# Patient Record
Sex: Male | Born: 1980 | Race: White | Hispanic: No | Marital: Married | Smoking: Former smoker
Health system: Southern US, Community
[De-identification: ages and names within clinical notes are randomized; demographics above are authoritative.]

## PROBLEM LIST (undated history)

## (undated) DIAGNOSIS — K219 Gastro-esophageal reflux disease without esophagitis: Secondary | ICD-10-CM

## (undated) DIAGNOSIS — E119 Type 2 diabetes mellitus without complications: Secondary | ICD-10-CM

## (undated) DIAGNOSIS — G709 Myoneural disorder, unspecified: Secondary | ICD-10-CM

## (undated) DIAGNOSIS — G44009 Cluster headache syndrome, unspecified, not intractable: Secondary | ICD-10-CM

## (undated) DIAGNOSIS — I10 Essential (primary) hypertension: Secondary | ICD-10-CM

## (undated) DIAGNOSIS — G4730 Sleep apnea, unspecified: Secondary | ICD-10-CM

## (undated) DIAGNOSIS — E7800 Pure hypercholesterolemia, unspecified: Secondary | ICD-10-CM

## (undated) HISTORY — DX: Cluster headache syndrome, unspecified, not intractable: G44.009

## (undated) HISTORY — DX: Pure hypercholesterolemia, unspecified: E78.00

## (undated) HISTORY — DX: Essential (primary) hypertension: I10

## (undated) HISTORY — PX: OTHER SURGICAL HISTORY: SHX169

---

## 2001-06-03 ENCOUNTER — Ambulatory Visit (HOSPITAL_COMMUNITY): Admission: RE | Admit: 2001-06-03 | Discharge: 2001-06-03 | Payer: Self-pay | Admitting: Pulmonary Disease

## 2002-05-21 ENCOUNTER — Encounter: Admission: RE | Admit: 2002-05-21 | Discharge: 2002-08-19 | Payer: Self-pay | Admitting: Specialist

## 2004-08-15 ENCOUNTER — Encounter: Admission: RE | Admit: 2004-08-15 | Discharge: 2004-08-15 | Payer: Self-pay | Admitting: Pulmonary Disease

## 2005-03-28 ENCOUNTER — Ambulatory Visit: Payer: Self-pay | Admitting: Endocrinology

## 2005-09-01 ENCOUNTER — Emergency Department (HOSPITAL_COMMUNITY): Admission: EM | Admit: 2005-09-01 | Discharge: 2005-09-01 | Payer: Self-pay | Admitting: Emergency Medicine

## 2006-03-13 ENCOUNTER — Ambulatory Visit: Payer: Self-pay | Admitting: Endocrinology

## 2006-04-01 ENCOUNTER — Ambulatory Visit: Payer: Self-pay | Admitting: Endocrinology

## 2006-04-12 ENCOUNTER — Ambulatory Visit: Payer: Self-pay | Admitting: Endocrinology

## 2006-05-09 ENCOUNTER — Ambulatory Visit: Payer: Self-pay | Admitting: Endocrinology

## 2006-06-10 ENCOUNTER — Ambulatory Visit: Payer: Self-pay | Admitting: Endocrinology

## 2006-11-08 ENCOUNTER — Ambulatory Visit: Payer: Self-pay | Admitting: Endocrinology

## 2007-10-20 ENCOUNTER — Encounter: Payer: Self-pay | Admitting: Endocrinology

## 2008-01-10 ENCOUNTER — Emergency Department (HOSPITAL_COMMUNITY): Admission: EM | Admit: 2008-01-10 | Discharge: 2008-01-10 | Payer: Self-pay | Admitting: Family Medicine

## 2008-02-11 ENCOUNTER — Encounter: Admission: RE | Admit: 2008-02-11 | Discharge: 2008-02-11 | Payer: Self-pay | Admitting: Otolaryngology

## 2009-01-03 ENCOUNTER — Encounter: Admission: RE | Admit: 2009-01-03 | Discharge: 2009-01-03 | Payer: Self-pay | Admitting: Internal Medicine

## 2009-03-23 ENCOUNTER — Encounter: Admission: RE | Admit: 2009-03-23 | Discharge: 2009-03-23 | Payer: Self-pay | Admitting: Internal Medicine

## 2009-10-25 ENCOUNTER — Encounter: Admission: RE | Admit: 2009-10-25 | Discharge: 2009-10-25 | Payer: Self-pay | Admitting: Emergency Medicine

## 2010-11-12 ENCOUNTER — Encounter: Payer: Self-pay | Admitting: Internal Medicine

## 2010-11-21 NOTE — Miscellaneous (Signed)
Summary: allopurinol  Clinical Lists Changes  Medications: Added new medication of ALLOPURINOL 100 MG  TABS (ALLOPURINOL) take 1 by mouth once daily Need office visit - Signed Rx of ALLOPURINOL 100 MG  TABS (ALLOPURINOL) take 1 by mouth once daily Need office visit;  #30 x 0;  Signed;  Entered by: Orlan Leavens;  Authorized by: Minus Breeding MD;  Method used: Electronic    Prescriptions: ALLOPURINOL 100 MG  TABS (ALLOPURINOL) take 1 by mouth once daily Need office visit  #30 x 0   Entered by:   Orlan Leavens   Authorized by:   Minus Breeding MD   Signed by:   Orlan Leavens on 10/20/2007   Method used:   Electronically sent to ...       [facility]*       [address]       [place], [place]  [address]       Ph: [phone]       Fax: [phone]   RxID:   [id number]   Denied need office visit last ov 04/12/2006

## 2011-05-10 ENCOUNTER — Other Ambulatory Visit: Payer: Self-pay | Admitting: Occupational Medicine

## 2011-05-10 ENCOUNTER — Ambulatory Visit
Admission: RE | Admit: 2011-05-10 | Discharge: 2011-05-10 | Disposition: A | Discharge status: Home / Self Care | Payer: Worker's Compensation | Source: Ambulatory Visit | Attending: Occupational Medicine | Admitting: Occupational Medicine

## 2011-05-10 DIAGNOSIS — S9782XA Crushing injury of left foot, initial encounter: Secondary | ICD-10-CM

## 2011-10-21 ENCOUNTER — Ambulatory Visit (INDEPENDENT_AMBULATORY_CARE_PROVIDER_SITE_OTHER): Payer: 59

## 2011-10-21 DIAGNOSIS — R3589 Other polyuria: Secondary | ICD-10-CM

## 2011-10-21 DIAGNOSIS — N476 Balanoposthitis: Secondary | ICD-10-CM

## 2011-10-21 DIAGNOSIS — R7309 Other abnormal glucose: Secondary | ICD-10-CM

## 2011-10-21 DIAGNOSIS — R358 Other polyuria: Secondary | ICD-10-CM

## 2011-10-21 DIAGNOSIS — R631 Polydipsia: Secondary | ICD-10-CM

## 2014-12-12 ENCOUNTER — Encounter (HOSPITAL_COMMUNITY): Payer: Self-pay | Admitting: *Deleted

## 2014-12-12 ENCOUNTER — Ambulatory Visit (INDEPENDENT_AMBULATORY_CARE_PROVIDER_SITE_OTHER): Payer: 59 | Admitting: Emergency Medicine

## 2014-12-12 ENCOUNTER — Emergency Department (HOSPITAL_COMMUNITY): Payer: 59

## 2014-12-12 ENCOUNTER — Emergency Department (HOSPITAL_COMMUNITY)
Admission: EM | Admit: 2014-12-12 | Discharge: 2014-12-12 | Disposition: A | Discharge status: Home / Self Care | Payer: 59 | Attending: Emergency Medicine | Admitting: Emergency Medicine

## 2014-12-12 VITALS — BP 134/72 | HR 101 | Temp 98.1°F | Resp 18 | Ht 69.5 in | Wt 329.0 lb

## 2014-12-12 DIAGNOSIS — E785 Hyperlipidemia, unspecified: Secondary | ICD-10-CM | POA: Insufficient documentation

## 2014-12-12 DIAGNOSIS — R110 Nausea: Secondary | ICD-10-CM | POA: Insufficient documentation

## 2014-12-12 DIAGNOSIS — E119 Type 2 diabetes mellitus without complications: Secondary | ICD-10-CM | POA: Insufficient documentation

## 2014-12-12 DIAGNOSIS — Z6841 Body Mass Index (BMI) 40.0 and over, adult: Secondary | ICD-10-CM | POA: Insufficient documentation

## 2014-12-12 DIAGNOSIS — H53452 Other localized visual field defect, left eye: Secondary | ICD-10-CM

## 2014-12-12 DIAGNOSIS — Z87891 Personal history of nicotine dependence: Secondary | ICD-10-CM | POA: Diagnosis not present

## 2014-12-12 DIAGNOSIS — I10 Essential (primary) hypertension: Secondary | ICD-10-CM | POA: Insufficient documentation

## 2014-12-12 DIAGNOSIS — R519 Headache, unspecified: Secondary | ICD-10-CM

## 2014-12-12 DIAGNOSIS — Z79899 Other long term (current) drug therapy: Secondary | ICD-10-CM | POA: Diagnosis not present

## 2014-12-12 DIAGNOSIS — Z794 Long term (current) use of insulin: Secondary | ICD-10-CM | POA: Diagnosis not present

## 2014-12-12 DIAGNOSIS — R51 Headache: Secondary | ICD-10-CM | POA: Diagnosis present

## 2014-12-12 HISTORY — DX: Type 2 diabetes mellitus without complications: E11.9

## 2014-12-12 LAB — BASIC METABOLIC PANEL
Anion gap: 9 (ref 5–15)
BUN: 8 mg/dL (ref 6–23)
CO2: 27 mmol/L (ref 19–32)
Calcium: 9.3 mg/dL (ref 8.4–10.5)
Chloride: 98 mmol/L (ref 96–112)
Creatinine, Ser: 0.76 mg/dL (ref 0.50–1.35)
GFR calc Af Amer: >90 mL/min (ref >90)
GFR calc non Af Amer: >90 mL/min (ref >90)
Glucose, Bld: 324 mg/dL — ABNORMAL HIGH (ref 70–99)
Potassium: 3.8 mmol/L (ref 3.5–5.1)
Sodium: 134 mmol/L — ABNORMAL LOW (ref 135–145)

## 2014-12-12 LAB — CBC WITH DIFFERENTIAL/PLATELET
Basophils Absolute: 0 10*3/uL (ref 0.0–0.1)
Basophils Relative: 0 % (ref 0–1)
Eosinophils Absolute: 0.2 10*3/uL (ref 0.0–0.7)
Eosinophils Relative: 2 % (ref 0–5)
HCT: 41.9 % (ref 39.0–52.0)
Hemoglobin: 14.5 g/dL (ref 13.0–17.0)
Lymphocytes Relative: 31 % (ref 12–46)
Lymphs Abs: 2.2 10*3/uL (ref 0.7–4.0)
MCH: 31.3 pg (ref 26.0–34.0)
MCHC: 34.6 g/dL (ref 30.0–36.0)
MCV: 90.5 fL (ref 78.0–100.0)
Monocytes Absolute: 0.5 10*3/uL (ref 0.1–1.0)
Monocytes Relative: 6 % (ref 3–12)
Neutro Abs: 4.4 10*3/uL (ref 1.7–7.7)
Neutrophils Relative %: 61 % (ref 43–77)
Platelets: 157 10*3/uL (ref 150–400)
RBC: 4.63 MIL/uL (ref 4.22–5.81)
RDW: 12.2 % (ref 11.5–15.5)
WBC: 7.3 10*3/uL (ref 4.0–10.5)

## 2014-12-12 MED ORDER — DIPHENHYDRAMINE HCL 50 MG/ML IJ SOLN
25.0000 mg | Freq: Once | INTRAMUSCULAR | Status: AC
Start: 2014-12-12 — End: 2014-12-12
  Administered 2014-12-12: 25 mg via INTRAVENOUS
  Filled 2014-12-12: qty 1

## 2014-12-12 MED ORDER — GADOBENATE DIMEGLUMINE 529 MG/ML IV SOLN
20.0000 mL | Freq: Once | INTRAVENOUS | Status: AC | PRN
  Administered 2014-12-12: 20 mL via INTRAVENOUS

## 2014-12-12 MED ORDER — METOCLOPRAMIDE HCL 5 MG/ML IJ SOLN
10.0000 mg | Freq: Once | INTRAMUSCULAR | Status: AC
  Administered 2014-12-12: 10 mg via INTRAVENOUS
  Filled 2014-12-12: qty 2

## 2014-12-12 NOTE — ED Provider Notes (Signed)
CSN: [id number]     Arrival date & time 12/12/14  1636 History   First MD Initiated Contact with Patient 12/12/14 1734     Chief Complaint  Patient presents with  . Headache     (Consider location/radiation/quality/duration/timing/severity/associated sxs/prior Treatment) Patient is a 34 y.o. male presenting with headaches.  Headache Pain location:  Generalized Quality:  Dull Radiates to:  Does not radiate Onset quality:  Gradual Duration:  2 weeks Timing:  Constant Progression:  Worsening Chronicity:  New Similar to prior headaches: no   Context: activity and bright light   Relieved by:  Nothing Worsened by:  Activity, light and sound Ineffective treatments:  None tried Associated symptoms: nausea and visual change (loss of L peripheral vision)   Associated symptoms: no abdominal pain, no tingling and no vomiting     Past Medical History  Diagnosis Date  . Diabetes mellitus without complication    History reviewed. No pertinent past surgical history. No family history on file. History  Substance Use Topics  . Smoking status: Former Games developermoker  . Smokeless tobacco: Never Used  . Alcohol Use: No    Review of Systems  Gastrointestinal: Positive for nausea. Negative for vomiting and abdominal pain.  Neurological: Positive for headaches.  All other systems reviewed and are negative.     Allergies  Codeine  Home Medications   Prior to Admission medications   Medication Sig Start Date End Date Taking? Authorizing Provider  ALPRAZolam Prudy Feeler(XANAX) 0.5 MG tablet Take 0.5 mg by mouth at bedtime as needed for anxiety.   Yes Historical Provider, MD  hydrochlorothiazide (MICROZIDE) 12.5 MG capsule Take 12.5 mg by mouth daily.   Yes Historical Provider, MD  ibuprofen (ADVIL,MOTRIN) 800 MG tablet Take 800 mg by mouth every 8 (eight) hours as needed.   Yes Historical Provider, MD  insulin aspart protamine- aspart (NOVOLOG MIX 70/30) (70-30) 100 UNIT/ML injection Inject 20-35 Units  into the skin 2 (two) times daily with a meal.    Yes Historical Provider, MD  insulin detemir (LEVEMIR) 100 UNIT/ML injection Inject 80 Units into the skin at bedtime.    Yes Historical Provider, MD  losartan (COZAAR) 100 MG tablet Take 100 mg by mouth daily.   Yes Historical Provider, MD  simvastatin (ZOCOR) 10 MG tablet Take 10 mg by mouth every morning.   Yes Historical Provider, MD  tiZANidine (ZANAFLEX) 4 MG tablet Take 4 mg by mouth every 6 (six) hours as needed for muscle spasms.   Yes Historical Provider, MD   BP 124/69 mmHg  Pulse 84  Temp(Src) 98.4 F (36.9 C) (Oral)  Resp 18  Ht 5\' 9"  (1.753 m)  Wt 329 lb (149.233 kg)  BMI 48.56 kg/m2  SpO2 97% Physical Exam  Constitutional: He is oriented to person, place, and time. He appears well-developed and well-nourished.  HENT:  Head: Normocephalic and atraumatic.  Eyes: Conjunctivae and EOM are normal.  Neck: Normal range of motion. Neck supple.  Cardiovascular: Normal rate, regular rhythm and normal heart sounds.   Pulmonary/Chest: Effort normal and breath sounds normal. No respiratory distress.  Abdominal: He exhibits no distension. There is no tenderness. There is no rebound and no guarding.  Musculoskeletal: Normal range of motion.  Neurological: He is alert and oriented to person, place, and time. He has normal strength and normal reflexes. A cranial nerve deficit (L peripheral vision loss) is present. No sensory deficit. Coordination normal. GCS eye subscore is 4. GCS verbal subscore is 5. GCS motor subscore is  6.  Pt states he has no L peripheral vision from L eye, EOM, otherwise no focal neuro deficits  Skin: Skin is warm and dry.  Vitals reviewed.   ED Course  Procedures (including critical care time) Labs Review Labs Reviewed  BASIC METABOLIC PANEL - Abnormal; Notable for the following:    Sodium 134 (*)    Glucose, Bld 324 (*)    All other components within normal limits  CBC WITH DIFFERENTIAL/PLATELET     Imaging Review Mr Laqueta Jean Wo Contrast  12/12/2014   CLINICAL DATA:  Initial evaluation for acute headache. History of migraines.  EXAM: MRI HEAD WITHOUT AND WITH CONTRAST  TECHNIQUE: Multiplanar, multiecho pulse sequences of the brain and surrounding structures were obtained without and with intravenous contrast.  CONTRAST:  20mL MULTIHANCE GADOBENATE DIMEGLUMINE 529 MG/ML IV SOLN  COMPARISON:  None available.  FINDINGS: The CSF containing spaces are within normal limits for patient age. No focal parenchymal signal abnormality is identified. No mass lesion, midline shift, or extra-axial fluid collection. There is mild prominence of the extra-axial space versus small arachnoid cyst anterior to the right frontal lobe, of doubtful clinical significance. Ventricles are normal in size without evidence of hydrocephalus.  No diffusion-weighted signal abnormality is identified to suggest acute intracranial infarct. Gray-white matter differentiation is maintained. Normal flow voids are seen within the intracranial vasculature. No intracranial hemorrhage identified.  The cervicomedullary junction is normal. Pituitary gland is within normal limits. Pituitary stalk is midline. The globes and optic nerves demonstrate a normal appearance with normal signal intensity.  No abnormal enhancement.  The bone marrow signal intensity is normal. Calvarium is intact. Visualized upper cervical spine is within normal limits.  Scalp soft tissues are unremarkable.  Paranasal sinuses are clear. Minimal fluid density noted within the inferior right mastoid air cells. Left mastoid air cells are clear.  IMPRESSION: Normal MRI of the brain with no acute intracranial abnormality identified.   Electronically Signed   By: Rise Mu M.D.   On: 12/12/2014 21:46     EKG Interpretation None       Ultrasound: Limited Ocular  Performed and interpreted by Dr Littie Deeds Indication: L eye vision loss of periphery Using high frequency  linear probe, ultrasound of the globe was performed in real time in two planes with patient looking left and right.  Interpretation: No retinal detachment visualized.  Lens was in proper location.  No hemorrhage appreciated.  Images were electronically archived.      MDM   Final diagnoses:  Acute nonintractable headache, unspecified headache type    34 y.o. male with pertinent PMH of DM presents with ha x 2 weeks, loss of peripheral vision 3 days ago.  No pain with chewing or temporal pain.  On arrival vitals and physical exam as above.  Neuro exam unremarkable with exception of L peripheral vision loss.  No coordination issues.  Physical exam otherwise unremarkable.  Wu, including MRI, unremarkable.  [place] of eye unremarkable for retinal detachment.  DC home to fu with neuro and ophthalmology  I have reviewed all laboratory and imaging studies if ordered as above  1. Acute nonintractable headache, unspecified headache type         Mirian Mo, MD 12/12/14 2233

## 2014-12-12 NOTE — ED Notes (Signed)
Pt sts he was seen by his PCP and prescribed muscle relaxers to help with his migraines.  Pt has been seen by a neurologist as well.  Pt sts the pain has been centered on the left side for approx 2 weeks, but moved approx 2 days ago to the right side.  Pt has no peripheral vision in the left side... sts "it started as blurring, but now it's just that I can't see anything at all".    Pt sts muscle relaxers ease off the pain for 1-2 hrs, but the pain comes back just as strong after.

## 2014-12-12 NOTE — ED Notes (Signed)
The pt is c/o a headache for 2 weeks.  Hx of tension headaches.  No n v.  The [facility] was [facility].  The pt has had some vision problems   Also.

## 2014-12-12 NOTE — ED Notes (Signed)
The pt was sent from [facility] [place]

## 2014-12-12 NOTE — Progress Notes (Signed)
Subjective:    Patient ID: Christopher Solomon, male    DOB: 10-Mar-1981, 34 y.o.   MRN: [id number]   PCP: Catalina PizzaHALL, ZACH, MD  Chief Complaint  Patient presents with  . Headache    x 2 weeks.  noticing some peripheral vision changes.  just came off of cipro for a sinus infection  . Facial Pain    right side face pain x 2 weeks feels like he is walking side ways    Allergies  Allergen Reactions  . Codeine Nausea And Vomiting    Patient Active Problem List   Diagnosis Date Noted  . Diabetes type 2, controlled 12/12/2014  . HTN (hypertension) 12/12/2014  . Hyperlipidemia 12/12/2014  . BMI 45.0-49.9, adult 12/12/2014    Prior to Admission medications   Medication Sig Start Date End Date Taking? Authorizing Provider  ALPRAZolam Prudy Feeler(XANAX) 0.5 MG tablet Take 0.5 mg by mouth at bedtime as needed for anxiety.   Yes Historical Provider, MD  hydrochlorothiazide (MICROZIDE) 12.5 MG capsule Take 12.5 mg by mouth daily.   Yes Historical Provider, MD  insulin aspart protamine- aspart (NOVOLOG MIX 70/30) (70-30) 100 UNIT/ML injection Inject into the skin.   Yes Historical Provider, MD  insulin detemir (LEVEMIR) 100 UNIT/ML injection Inject into the skin at bedtime.   Yes Historical Provider, MD  losartan (COZAAR) 100 MG tablet Take 100 mg by mouth daily.   Yes Historical Provider, MD  SIMVASTATIN PO Take by mouth daily.   Yes Historical Provider, MD    Medical, Surgical, Family and Social History reviewed and updated.  HPI  Presents reporting persistent headache and loss of peripheral vision on the LEFT.  Increased head and LEFT facial pain with leaning over or coughing. Reduced peripheral vision on the LEFT. Started like a blurry spot about 2 weeks ago, then seemed like a floater, and for the past 3 days reports loss of vision on the left.  History of tension HA, evaluated by the specialist about 10 years ago. Treated successfully with NSAIDS and muscle relaxers.  "This started off feeling  like a tension HA. What's got me worried is the pain behind my eye, and the floater." This HA started on the LEFT side, then moved to the RIGHT. "This started as a sinus infection," which was treated by his PCP with a short course of Cipro ("it was only 3-5 days, I think it was 500 mg"). All the symptoms resolved except the HA. He returned to his PCP and HCTZ was added to his regular medications, and was prescribed Ibuprofen 800 mg and Zanaflex 4 mg. They have helped, but pain persists. At its worst, the pain is 8/10, "it's like somebody's jabbing something in." 3-4/10 after a dose of ibuprofen and Zanaflex.  "When I'm walking, I feel like I'm walking crooked."  "A little woozy if I stand up to quickly," but not dizzy. Nausea with pounding headache, vomiting x 1.  No numbness or tingling. Feels like he's ghost walking sometimes, "Like I don't have any legs," "not often," but no weakness.  "What makes me worried is that a friend died of a cerebral aneurysm about 2 years ago" after complaining of a headache.  Home glucose is improving, but still in the 200's.  Review of Systems Reduced libido. Had labs performed at PCP office last week. No CP, SOB, stool or urinary changes. Some ear popping. No sore throat.    Objective:   Physical Exam  Constitutional: He is oriented to person, place,  and time. Vital signs are normal. He appears well-developed and well-nourished. He is active and cooperative. No distress.  BP 134/72 mmHg  Pulse 101  Temp(Src) 98.1 F (36.7 C) (Oral)  Resp 18  Ht 5' 9.5" (1.765 m)  Wt 329 lb (149.233 kg)  BMI 47.90 kg/m2  SpO2 97%  HENT:  Head: Normocephalic and atraumatic.  Right Ear: Hearing, tympanic membrane, external ear and ear canal normal.  Left Ear: Hearing, tympanic membrane, external ear and ear canal normal.  Nose: Nose normal. Right sinus exhibits no maxillary sinus tenderness and no frontal sinus tenderness. Left sinus exhibits no maxillary sinus  tenderness and no frontal sinus tenderness.  Mouth/Throat: Uvula is midline, oropharynx is clear and moist and mucous membranes are normal. No oropharyngeal exudate.  Eyes: Conjunctivae, EOM and lids are normal. Pupils are equal, round, and reactive to light. Right eye exhibits no chemosis, no discharge, no exudate and no hordeolum. No foreign body present in the right eye. Left eye exhibits no chemosis, no discharge, no exudate and no hordeolum. No foreign body present in the left eye. No scleral icterus.  Fundoscopic exam:      The right eye shows no arteriolar narrowing, no AV nicking, no exudate, no hemorrhage and no papilledema. The right eye shows red reflex and venous pulsations.       The left eye shows no arteriolar narrowing, no AV nicking, no exudate, no hemorrhage and no papilledema. The left eye shows red reflex and venous pulsations.  Loss of lateral/inferior vision in the LEFT eye.  Neck: Trachea normal, normal range of motion and full passive range of motion without pain. Neck supple. Normal carotid pulses present. No muscular tenderness present. Carotid bruit is not present. No Brudzinski's sign and no Kernig's sign noted. No thyromegaly present.  Cardiovascular: Regular rhythm and normal heart sounds.   No extrasystoles are present. Tachycardia present.   No murmur heard. Pulses:      Radial pulses are 2+ on the right side, and 2+ on the left side.  Pulmonary/Chest: Effort normal and breath sounds normal.  Lymphadenopathy:       Head (right side): No tonsillar, no preauricular, no posterior auricular and no occipital adenopathy present.       Head (left side): No tonsillar, no preauricular, no posterior auricular and no occipital adenopathy present.    He has no cervical adenopathy.       Right: No supraclavicular adenopathy present.       Left: No supraclavicular adenopathy present.  Neurological: He is alert and oriented to person, place, and time. He has normal strength. No  sensory deficit. He displays a negative Romberg sign.  Unable to elicit DTRs  Skin: Skin is warm, dry and intact. No rash noted. No cyanosis or erythema. Nails show no clubbing.  Psychiatric: He has a normal mood and affect. His speech is normal and behavior is normal.       Assessment & Plan:  1. Acute intractable headache, unspecified headache type 2. Loss of peripheral visual field, left Unclear if this is central, related to the headache or ophthalmological issue like detached retina. Needs MRI brain with contrast. Advised to go the ED now.  Seen with Dr. Cleta Alberts.  Fernande Bras, PA-C Physician Assistant-Certified [facility]

## 2014-12-12 NOTE — Patient Instructions (Signed)
Go to the Emergency Department now. You need additional evaluation, including MRI of the brain with contrast.

## 2014-12-12 NOTE — ED Notes (Signed)
MRI contacted.  Pt been dispatched for at this time.  Pt updated.

## 2014-12-12 NOTE — Discharge Instructions (Signed)

## 2014-12-22 ENCOUNTER — Other Ambulatory Visit: Payer: Self-pay | Admitting: Neurology

## 2014-12-22 ENCOUNTER — Telehealth: Payer: Self-pay | Admitting: *Deleted

## 2014-12-22 ENCOUNTER — Telehealth: Payer: Self-pay | Admitting: Neurology

## 2014-12-22 ENCOUNTER — Ambulatory Visit (INDEPENDENT_AMBULATORY_CARE_PROVIDER_SITE_OTHER): Payer: 59 | Admitting: Neurology

## 2014-12-22 ENCOUNTER — Ambulatory Visit
Admission: RE | Admit: 2014-12-22 | Discharge: 2014-12-22 | Disposition: A | Discharge status: Home / Self Care | Payer: 59 | Source: Ambulatory Visit | Attending: Neurology | Admitting: Neurology

## 2014-12-22 ENCOUNTER — Encounter: Payer: Self-pay | Admitting: Neurology

## 2014-12-22 VITALS — BP 141/89 | HR 78 | Ht 69.0 in | Wt 320.0 lb

## 2014-12-22 DIAGNOSIS — G43101 Migraine with aura, not intractable, with status migrainosus: Secondary | ICD-10-CM

## 2014-12-22 DIAGNOSIS — G932 Benign intracranial hypertension: Secondary | ICD-10-CM

## 2014-12-22 DIAGNOSIS — G4733 Obstructive sleep apnea (adult) (pediatric): Secondary | ICD-10-CM

## 2014-12-22 LAB — CSF CELL COUNT WITH DIFFERENTIAL
Eosinophils, CSF: 4 % — ABNORMAL HIGH (ref 0–1)
Lymphs, CSF: 11 % — ABNORMAL LOW (ref 40–80)
Monocyte/Macrophage: 3 % — ABNORMAL LOW (ref 15–45)
RBC Count, CSF: 32750 cu mm — ABNORMAL HIGH
Segmented Neutrophils-CSF: 82 % — ABNORMAL HIGH (ref 0–6)
Tube #: 3
WBC, CSF: 23 cu mm — ABNORMAL HIGH (ref 0–5)

## 2014-12-22 LAB — GLUCOSE, CSF: Glucose, CSF: 133 mg/dL — ABNORMAL HIGH (ref 43–76)

## 2014-12-22 LAB — PROTEIN, CSF: Total Protein, CSF: 410 mg/dL — ABNORMAL HIGH (ref 15–45)

## 2014-12-22 MED ORDER — VERAPAMIL HCL ER 240 MG PO CP24: 240.0000 mg | ORAL_CAPSULE | Freq: Every day | ORAL | Status: DC

## 2014-12-22 NOTE — Progress Notes (Addendum)
[facility]    Provider:  Dr Lucia GaskinsAhern Referring Provider: Catalina PizzaHall, Zach, MD Primary Care Physician:  Catalina PizzaHALL, ZACH, MD  CC:  Vision loss  HPI:  Christopher Solomon is a 34 y.o. male here as a referral from Dr. Margo AyeHall for vision loss.  PMHx of HTN, DM, HLD, morbid obesity. Symptoms started 3 weeks ago. He has constant headache. He would get blind spot in the left eye, a squiggly floater, peripheral vision is gone in the left eye, he has lost vision. He has been to an ophthalmologic and a retinal specialist and everything normal. Dr. Peggye Pittichards is the retina specialist, Dr. Ronney AstersJohn Scott [facility]. They did all kinds of testing. It is grey on the left side. He is having episodes of light coming on the left periphery. The headaches on the right side of the head and into the neck. He has a lot of ear popping. He has been evaluated by ENT. He has a history of tension headaches. He has sleep apnea and has not used the CPAP in years. He has snoring, excessive daytime fatigue. Persistent for 3 weeks, 5-10/10 pain. +light sensitivity, +sound sensitivity, constant pressure, has to go into a dark room. +nausea. The vision hasn't come back yet. Vision loss happened a week ago and hasn't come back yet. He was tried on topamax for the tension headaches not for the migraines, never had a migraine below.   Reviewed notes, labs and imaging from outside physicians, which showed:  Notes from [facility]  OD 20/30 OS 20/40 PERRL, no APD, slit lamp normal, fundoscopic exam PS CSCR A1c 11  MRI of the brain normal, personally reviewed images CBC WNL BMP with elevated glucose (324)   Review of Systems: Patient complains of symptoms per HPI as well as the following symptoms: fatigue, blurred vision, dizziness, snoring, ringing in ears, racing thoughts. Pertinent negatives per HPI. All others negative.   History   Social History  . Marital Status: Married    Spouse Name: Wyatt Mageabitha  .  Number of Children: 1  . Years of Education: 12th grade   Occupational History  . Street Maintenance Supervisor     [employer]   Social History Main Topics  . Smoking status: Former Smoker    Quit date: 10/23/1999  . Smokeless tobacco: Never Used  . Alcohol Use: No  . Drug Use: No  . Sexual Activity: Not on file   Other Topics Concern  . Not on file   Social History Narrative   Lives with his wife and son.   Caffeine: 2 soft drinks/ day   Occasional coffee use    Family History  Problem Relation Age of Onset  . Diabetes Paternal Grandfather   . Cancer Paternal Grandmother   . Lung cancer Maternal Grandmother     Past Medical History  Diagnosis Date  . Diabetes mellitus without complication   . Hypertension   . High cholesterol   . Headaches, cluster     tension    History reviewed. No pertinent past surgical history.  Current Outpatient Prescriptions  Medication Sig Dispense Refill  . ALPRAZolam (XANAX) 0.5 MG tablet Take 0.5 mg by mouth at bedtime as needed for anxiety.    . hydrochlorothiazide (MICROZIDE) 12.5 MG capsule Take 12.5 mg by mouth daily.    Marland Kitchen. ibuprofen (ADVIL,MOTRIN) 800 MG tablet Take 800 mg by mouth every 8 (eight) hours as needed.    . insulin aspart protamine- aspart (NOVOLOG MIX 70/30) (  70-30) 100 UNIT/ML injection Inject 20-35 Units into the skin 2 (two) times daily with a meal.     . insulin detemir (LEVEMIR) 100 UNIT/ML injection Inject 80 Units into the skin at bedtime.     Marland Kitchen losartan (COZAAR) 100 MG tablet Take 100 mg by mouth daily.    . ondansetron (ZOFRAN) 4 MG tablet Take 4 mg by mouth every 8 (eight) hours as needed for nausea or vomiting.    . simvastatin (ZOCOR) 10 MG tablet Take 10 mg by mouth every morning.    Marland Kitchen tiZANidine (ZANAFLEX) 4 MG tablet Take 4 mg by mouth every 6 (six) hours as needed for muscle spasms.    . verapamil (VERELAN PM) 240 MG 24 hr capsule Take 1 capsule (240 mg total) by mouth at bedtime. 30 capsule 6     No current facility-administered medications for this visit.    Allergies as of 12/22/2014 - Review Complete 12/22/2014  Allergen Reaction Noted  . Reglan [metoclopramide] Other (See Comments) 12/22/2014  . Codeine Nausea And Vomiting 12/12/2014    Vitals: BP 141/89 mmHg  Pulse 78  Ht  (1.753 m)  Wt 320 lb (145.151 kg)  BMI 47.23 kg/m2 Last Weight:  Wt Readings from Last 1 Encounters:  12/22/14 320 lb (145.151 kg)   Last Height:   Ht Readings from Last 1 Encounters:  12/22/14  (1.753 m)   Physical exam: Exam: Gen: NAD, conversant, well nourised, morbidly obese(BMI 47.3), well groomed      CV: RRR, no MRG. No Carotid Bruits. No peripheral edema, warm, nontender Eyes: Conjunctivae clear without exudates or hemorrhage  Neuro: Detailed Neurologic Exam  Speech:    Speech is normal; fluent and spontaneous with normal comprehension.  Cognition:    The patient is oriented to person, place, and time;     recent and remote memory intact;     language fluent;     normal attention, concentration,     fund of knowledge Cranial Nerves:    The pupils are equal, round, and reactive to light. Fundi with mild bilat edema. Left homonomous hemianopia.  Extraocular movements are intact. Trigeminal sensation is intact and the muscles of mastication are normal. The face is symmetric. The palate elevates in the midline. Hearing intact. Voice is normal. Shoulder shrug is normal. The tongue has normal motion without fasciculations.   Coordination:    No dysmetria   Gait:    No ataxia  Motor Observation:    No asymmetry, no atrophy, and no involuntary movements noted. Tone:    Normal muscle tone.    Posture:    Posture is normal. normal erect    Strength:    Strength is V/V in the upper and lower limbs.      Sensation: intact to LT     Reflex Exam:  DTR's: Absent lowers.    Toes:    The toes are equivocal bilaterally.   Clonus:    Clonus is absent.       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 MRI of the brain normal. I ordered LP due to mild edema of the fundi, normal opening pressure, with normal WBCs when adjusted for RBCs, elevated protein of 400 (RBCs 32000) which is still high even when adjusted for RBCs.  Elevated protein in the csf could be secondary to uncontrolled diabetes. Unlikely  other causes (MS, brain tumor, brain abscess, meningitis) given normal brain mri. Also could be due to spinal cord lesion however presenting symptom is visual loss.  Considering that workup so far is normal, could be a migraine disorder. The persistent vision loss is unusual but could happen.  Will increase his Verapamil from 120 to 240.  Daily ASA for stroke prevention  Needs to follow closely with pcp to manage vascular risk factors  Need sleep study, morbidly obese with diagnosed OSA years ago and has not used cpap. Slep study ordered.   Discussed compliance with cpap and medications.     Naomie Dean, MD  [facility] [address] [place], [place] [address]  Phone [phone] Fax [phone]

## 2014-12-22 NOTE — Telephone Encounter (Signed)
Request faxed over to Dr. [facility].

## 2014-12-22 NOTE — Discharge Instructions (Signed)

## 2014-12-22 NOTE — Telephone Encounter (Signed)
Talked with Christopher Solomon from [facility]. It was a high alert for his WBC at 23. I made Dr. Lucia GaskinsAhern aware and was told by Christopher Solomon she will fax over the information.

## 2014-12-22 NOTE — Patient Instructions (Addendum)
Overall you are doing fairly well but I do want to suggest a few things today:   Remember to drink plenty of fluid, eat healthy meals and do not skip any meals. Try to eat protein with a every meal and eat a healthy snack such as fruit or nuts in between meals. Try to keep a regular sleep-wake schedule and try to exercise daily, particularly in the form of walking, 20-30 minutes a day, if you can.   As far as your medications are concerned: continue as prescribed  Need to use CPAP consistently. Need to follow closely with primary care for management of vascular risk factors such as diabetes, blood pressure, cholesterol and weight loss. Suggest repeat sleep study with O2 monitor to evaluate for Obesity Hypoventilation Syndrome as well as OSA cpap titration.   As far as diagnostic testing: Lumbar puncture. Based ob results may follow up with more imaging and tests  I would like to see you back after testing is complete, sooner if we need to. Please call us with any interim questions, concerns, problems, updates or refill requests.   Please also call us for any test results so we can go over those with you on the phone.  My clinical assistant and will answer any of your questions and relay your messages to me and also relay most of my messages to you.   Our phone number is [phone]. We also have an after hours call service for urgent matters and there is a physician on-call for urgent questions. For any emergencies you know to call 911 or go to the nearest emergency room

## 2014-12-22 NOTE — Telephone Encounter (Signed)
Talked to patient and his wife today. Discussed LP findings, normal opening pressure. Symptoms likely migraine with aura. Will have him back in the office tomorrow for migraine cocktail. Will also increase his verapamil.

## 2014-12-23 ENCOUNTER — Ambulatory Visit (INDEPENDENT_AMBULATORY_CARE_PROVIDER_SITE_OTHER): Payer: 59 | Admitting: *Deleted

## 2014-12-23 ENCOUNTER — Telehealth: Payer: Self-pay | Admitting: *Deleted

## 2014-12-23 DIAGNOSIS — G43101 Migraine with aura, not intractable, with status migrainosus: Secondary | ICD-10-CM | POA: Diagnosis not present

## 2014-12-23 MED ORDER — VALPROATE SODIUM 500 MG/5ML IV SOLN
1000.0000 mg | Freq: Once | INTRAVENOUS | Status: AC
  Administered 2014-12-23: 1000 mg via INTRAVENOUS

## 2014-12-23 MED ORDER — PROCHLORPERAZINE EDISYLATE 5 MG/ML IJ SOLN
10.0000 mg | Freq: Once | INTRAMUSCULAR | Status: AC
  Administered 2014-12-23: 10 mg via INTRAVENOUS

## 2014-12-23 MED ORDER — KETOROLAC TROMETHAMINE 30 MG/ML IJ SOLN
30.0000 mg | Freq: Once | INTRAMUSCULAR | Status: AC
  Administered 2014-12-23: 30 mg via INTRAVENOUS

## 2014-12-23 NOTE — Progress Notes (Signed)
Pt here for a migraine cocktail minus the solumedrol per his hx of DM. Wife at side. 22G started in right hand, given compazine, toradol, and depacon per instructions. Pt pain was 6/10 on arrival with no vision in the left periphery of both eyes.   After medications were given, pt reported that pain was 4/10. Still having visual issues. IV removed and pt and wife sent home.

## 2014-12-23 NOTE — Telephone Encounter (Signed)
Left a message for the patient to call us back. Gave [facility] phone number.  

## 2014-12-23 NOTE — Telephone Encounter (Signed)
Called patient to have him come in at 1:30 for a migraine cocktail. Told him to come in 15 min before. Pt verbalized understanding.

## 2014-12-23 NOTE — Telephone Encounter (Signed)
Called [facility] to find out if the patient had a formal visual field test and they said the patient has not. I told them we might send the patient to them to get this done but I was going to let Dr. Lucia GaskinsAhern know and we would have the pt call to set that up or we would give them a call back.

## 2014-12-24 ENCOUNTER — Telehealth: Payer: Self-pay | Admitting: *Deleted

## 2014-12-24 ENCOUNTER — Other Ambulatory Visit: Payer: Self-pay | Admitting: Neurology

## 2014-12-24 DIAGNOSIS — R51 Headache: Secondary | ICD-10-CM

## 2014-12-24 DIAGNOSIS — G4719 Other hypersomnia: Secondary | ICD-10-CM

## 2014-12-24 DIAGNOSIS — R519 Headache, unspecified: Secondary | ICD-10-CM

## 2014-12-24 NOTE — Telephone Encounter (Signed)
Already spoke with pt. See recent phone note.

## 2014-12-24 NOTE — Telephone Encounter (Signed)
Already Spoke with patient. See most recent phone note.

## 2014-12-24 NOTE — Telephone Encounter (Signed)
Pt returned your call and would like you to call him back.

## 2014-12-24 NOTE — Telephone Encounter (Signed)
Talked with patient to let him know to call [facility] to schedule for an appointment to get a visual field test done. I gave pt the phone number [phone]. Pt was also wondering if work note was completed by Dr. Lucia GaskinsAhern and I told him she was still completing it and we would call him once it was finished. He was wondering if he could go back to work, he knew he could not drive. I told him I would check with Dr. Lucia GaskinsAhern and call him back.

## 2014-12-26 ENCOUNTER — Encounter: Payer: Self-pay | Admitting: Neurology

## 2014-12-26 NOTE — Telephone Encounter (Signed)
Christopher Solomon - Letter has been composed, please see in chart. Please print and mail to patient. Thank you.

## 2014-12-27 ENCOUNTER — Telehealth: Payer: Self-pay | Admitting: *Deleted

## 2014-12-27 ENCOUNTER — Telehealth: Payer: Self-pay | Admitting: Neurology

## 2014-12-27 DIAGNOSIS — Z6841 Body Mass Index (BMI) 40.0 and over, adult: Secondary | ICD-10-CM

## 2014-12-27 DIAGNOSIS — E662 Morbid (severe) obesity with alveolar hypoventilation: Secondary | ICD-10-CM

## 2014-12-27 NOTE — Telephone Encounter (Signed)
Letter from Dr. Lucia GaskinsAhern on 12-26-14 [facility]16 at 1035 am.

## 2014-12-27 NOTE — Telephone Encounter (Signed)
Talked with the pt. Christopher Solomon and told him we faxed over the letter he requested. He also has some questions about seeing dots and blurry vision. I spoke with Dr. Lucia GaskinsAhern and she advised me that this can persist for weeks, even months and that we will follow closely with him. 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 He verbalized he was and said they have not called him back yet. I told him Dr. Lucia GaskinsAhern would also give him a call back. Pt verbalized understanding.

## 2014-12-27 NOTE — Telephone Encounter (Signed)
Naomie DeanAntonia Ahern, MD refers patient for attended sleep study.  Height: 5'9"  Weight: 320 lb  BMI: 47.23   Past Medical History:  Diabetes mellitus without complication    . Hypertension   . High cholesterol   . Headaches, cluster     tension           Sleep Symptoms:  Morbidly obese with diagnosed OSA years ago and has not used cpap, fatigue, snoring,     Epworth Score: Unknown  Medication:  ALPRAZolam (Tab) XANAX 0.5 MG Take 0.5 mg by mouth at bedtime as needed for anxiety.       Hydrochlorothiazide (Cap) MICROZIDE 12.5 MG Take 12.5 mg by mouth daily.      Ibuprofen (Tab) ADVIL,MOTRIN 800 MG Take 800 mg by mouth every 8 (eight) hours as needed.      Insulin Aspart Prot & Aspart (Suspension) NOVOLOG MIX 70/30 (70-30) 100 UNIT/ML Inject 20-35 Units into the skin 2 (two) times daily with a meal.       Insulin Detemir (Solution) LEVEMIR 100 UNIT/ML Inject 80 Units into the skin at bedtime.       Losartan Potassium (Tab) COZAAR 100 MG Take 100 mg by mouth daily.      Ondansetron HCl (Tab) ZOFRAN 4 MG Take 4 mg by mouth every 8 (eight) hours as needed for nausea or vomiting.      Simvastatin (Tab) ZOCOR 10 MG Take 10 mg by mouth every morning.      TiZANidine HCl (Tab) ZANAFLEX 4 MG Take 4 mg by mouth every 6 (six) hours as needed for muscle spasms.      Verapamil HCl (Capsule SR 24 hr) VERELAN PM 240 MG Take 1 capsule (240 mg total) by mouth at bedtime.      .         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 MRI of the brain normal. I ordered LP due to mild edema of the fundi, normal opening pressure, with normal WBCs when adjusted for RBCs, elevated protein of 400 (RBCs 32000) which is still high even when adjusted for RBCs.  Elevated  protein in the csf could be secondary to uncontrolled diabetes. Unlikely other causes (MS, brain tumor, brain abscess, meningitis) given normal brain mri. Also could be due to spinal cord lesion however presenting symptom is visual loss.  Considering that workup so far is normal, could be a migraine disorder. The persistent vision loss is unusual but could happen.  Will increase his Verapamil from 120 to 240.  Daily ASA for stroke prevention  Needs to follow closely with pcp to manage vascular risk factors  Need sleep study, morbidly obese with diagnosed OSA years ago and has not used cpap. Slep study ordered.   Discussed compliance with cpap and medications.       Please review patient information and submit instructions for scheduling and orders for sleep technologist. Thank you.

## 2014-12-27 NOTE — Telephone Encounter (Signed)
Patient requesting letter faxed to spouses job, fax # [phone], attention: [name] Crisanti.  Please call @ [phone] and advise.

## 2014-12-27 NOTE — Telephone Encounter (Signed)
Mailed letter to patient today from Dr. Lucia GaskinsAhern on 12-26-14.

## 2014-12-28 ENCOUNTER — Other Ambulatory Visit: Payer: Self-pay | Admitting: Neurology

## 2014-12-28 DIAGNOSIS — R110 Nausea: Secondary | ICD-10-CM

## 2014-12-28 MED ORDER — ONDANSETRON 4 MG PO TBDP: 4.0000 mg | ORAL_TABLET | Freq: Three times a day (TID) | ORAL | Status: DC | PRN

## 2014-12-28 NOTE — Telephone Encounter (Signed)
Christopher Solomon - can you schedule mr Arroyave for a 30 minute appt next friday. I will be in the office next Friday, need to open up my spots. Can you do that for me and schedule mr Stradley. He is feeling better.

## 2014-12-30 ENCOUNTER — Telehealth: Payer: Self-pay | Admitting: *Deleted

## 2014-12-30 NOTE — Telephone Encounter (Signed)
Spoke with pt and he will come in on 01/07/15 at 9:00 am. Told him to arrive 15min beforehand. Pt verbalized understanding.

## 2014-12-30 NOTE — Telephone Encounter (Signed)
Left a message for the pt to call us back. Gave [facility] phone number.  

## 2015-01-07 ENCOUNTER — Ambulatory Visit (INDEPENDENT_AMBULATORY_CARE_PROVIDER_SITE_OTHER): Payer: 59 | Admitting: Neurology

## 2015-01-07 ENCOUNTER — Encounter: Payer: Self-pay | Admitting: Neurology

## 2015-01-07 VITALS — BP 152/87 | HR 82 | Temp 98.0°F | Ht 69.0 in | Wt 331.0 lb

## 2015-01-07 DIAGNOSIS — G43101 Migraine with aura, not intractable, with status migrainosus: Secondary | ICD-10-CM

## 2015-01-07 DIAGNOSIS — H539 Unspecified visual disturbance: Secondary | ICD-10-CM | POA: Diagnosis not present

## 2015-01-07 MED ORDER — DICLOFENAC POTASSIUM(MIGRAINE) 50 MG PO PACK: 50.0000 mg | PACK | Freq: Once | ORAL | Status: DC | PRN

## 2015-01-07 MED ORDER — TOPIRAMATE ER 100 MG PO CAP24: 100.0000 mg | ORAL_CAPSULE | Freq: Every day | ORAL | Status: DC

## 2015-01-07 NOTE — Patient Instructions (Addendum)
Overall you are doing fairly well but I do want to suggest a few things today:   Remember to drink plenty of fluid, eat healthy meals and do not skip any meals. Try to eat protein with a every meal and eat a healthy snack such as fruit or nuts in between meals. Try to keep a regular sleep-wake schedule and try to exercise daily, particularly in the form of walking, 20-30 minutes a day, if you can.   As far as your medications are concerned, I would like to suggest: Continue the Verapamil. Start Trokendi Week one : 25mg  in the evening Week 2: 50mg  in the evening Week 3 and forward: 100mg  in the evening  Cambia at onset of headache. Maximum twice daily, nor more than 3 days in a week.   I would like to see you back in , sooner if we need to. Please call us with any interim questions, concerns, problems, updates or refill requests.   Please also call us for any test results so we can go over those with you on the phone.  My clinical assistant and will answer any of your questions and relay your messages to me and also relay most of my messages to you.   Our phone number is [phone]. We also have an after hours call service for urgent matters and there is a physician on-call for urgent questions. For any emergencies you know to call 911 or go to the nearest emergency room

## 2015-01-07 NOTE — Progress Notes (Signed)
[facility]    Provider:  Dr Lucia Gaskins Referring Provider: Catalina Pizza, MD Primary Care Physician:  Catalina Pizza, MD  CC:  Migraines and vision loss  HPI:  Christopher Solomon is a 34 y.o. male here as a follow up. PMHx of HTN, DM, HLD, morbid obesity.  His vision is improving. He is still seeing "lightning" on the left side of his vision. That is improving also. He can see on the left now. He has a follow up appointment for sleep study on April 15th. The headaches are getting better, worse in the morning and then later in the evenings after work they also get worse. Headache is every other day until he closes his eyes and lays down, could last all day.  +light sensitivity, +sound sensitivity, constant pressure, has to go into a dark room. +nausea. He has not been getting as much of the nausea.   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 12/29/2014.   Initial visit 12/22/2014: Christopher Solomon is a 34 y.o. male here as a referral from Dr. Margo Aye for vision loss.  PMHx of HTN, DM, HLD, morbid obesity. Symptoms started 3 weeks ago. He has constant headache. He would get blind spot in the left eye, a squiggly floater, peripheral vision is gone in the left eye, he has lost vision. He has been to an ophthalmologic and a retinal specialist and everything normal. Dr. Peggye Pitt is the retina specialist, Dr. Ronney Asters [facility]. They did all kinds of testing. It is grey on the left side. He is having episodes of light coming on the left periphery. The headaches on the right side of the head and into the neck. He has a lot of ear popping. He has been evaluated by ENT. He has a history of tension headaches. He has sleep apnea and has not used the CPAP in years. He has snoring, excessive daytime fatigue. Persistent for 3 weeks, 5-10/10 pain. +light sensitivity, +sound sensitivity, constant pressure, has to go into a dark room. +nausea. The vision hasn't come back yet. Vision  loss happened a week ago and hasn't come back yet. He was tried on topamax for the tension headaches not for the migraines, never had a migraine below.   Reviewed notes, labs and imaging from outside physicians, which showed:  Notes from [facility]  OD 20/30 OS 20/40 PERRL, no APD, slit lamp normal, fundoscopic exam PS CSCR A1c 11  MRI of the brain normal, personally reviewed images CBC WNL BMP with elevated glucose (324)  Review of Systems: Patient complains of symptoms per HPI as well as the following symptoms: vision loss, headache, No CP, no SOB. Pertinent negatives per HPI. All others negative.   History   Social History  . Marital Status: Married    Spouse Name: Wyatt Mage  . Number of Children: 1  . Years of Education: 12th grade   Occupational History  . Street Maintenance Supervisor     [employer]   Social History Main Topics  . Smoking status: Former Smoker    Quit date: 10/23/1999  . Smokeless tobacco: Never Used  . Alcohol Use: No  . Drug Use: No  . Sexual Activity: Not on file   Other Topics Concern  . Not on file   Social History Narrative   Lives with his wife and son.   Caffeine: 2 soft drinks/ day   Occasional coffee use    Family History  Problem Relation Age of Onset  .  Diabetes Paternal Grandfather   . Cancer Paternal Grandmother   . Lung cancer Maternal Grandmother     Past Medical History  Diagnosis Date  . Diabetes mellitus without complication   . Hypertension   . High cholesterol   . Headaches, cluster     tension    Past Surgical History  Procedure Laterality Date  . No surgical history      Current Outpatient Prescriptions  Medication Sig Dispense Refill  . ALPRAZolam (XANAX) 0.5 MG tablet Take 0.5 mg by mouth at bedtime as needed for anxiety.    . fluticasone (FLONASE) 50 MCG/ACT nasal spray Place 2 sprays into both nostrils as needed.    . hydrochlorothiazide (MICROZIDE) 12.5 MG capsule Take 12.5 mg  by mouth daily.    Marland Kitchen HYDROcodone-acetaminophen (NORCO/VICODIN) 5-325 MG per tablet Take 1 tablet by mouth as needed.    Marland Kitchen ibuprofen (ADVIL,MOTRIN) 800 MG tablet Take 800 mg by mouth every 8 (eight) hours as needed.    . insulin aspart protamine- aspart (NOVOLOG MIX 70/30) (70-30) 100 UNIT/ML injection Inject 20-35 Units into the skin 2 (two) times daily with a meal.     . insulin detemir (LEVEMIR) 100 UNIT/ML injection Inject 80 Units into the skin at bedtime.     Marland Kitchen losartan (COZAAR) 100 MG tablet Take 100 mg by mouth daily.    Marland Kitchen NOVOLOG FLEXPEN 100 UNIT/ML FlexPen     . ondansetron (ZOFRAN ODT) 4 MG disintegrating tablet Take 1 tablet (4 mg total) by mouth every 8 (eight) hours as needed for nausea or vomiting. 60 tablet 3  . ondansetron (ZOFRAN) 4 MG tablet Take 4 mg by mouth every 8 (eight) hours as needed for nausea or vomiting.    . simvastatin (ZOCOR) 20 MG tablet Take 20 mg by mouth daily.    . Testosterone 25 MG/2.5GM (1%) GEL Place 25 mg onto the skin daily.    Marland Kitchen tiZANidine (ZANAFLEX) 4 MG tablet Take 4 mg by mouth every 6 (six) hours as needed for muscle spasms.    . verapamil (VERELAN PM) 240 MG 24 hr capsule Take 1 capsule (240 mg total) by mouth at bedtime. 30 capsule 6  . Diclofenac Potassium 50 MG PACK Take 50 mg by mouth once as needed. Take once daily as needed with headache onset. Please take with food 30 each 6  . Topiramate ER 100 MG CP24 Take 100 mg by mouth at bedtime. 30 capsule 6   No current facility-administered medications for this visit.    Allergies as of 01/07/2015 - Review Complete 01/07/2015  Allergen Reaction Noted  . Reglan [metoclopramide] Other (See Comments) 12/22/2014  . Codeine Nausea And Vomiting 12/12/2014    Vitals: BP 152/87 mmHg  Pulse 82  Temp(Src) 98 F (36.7 C)  Ht  (1.753 m)  Wt 331 lb (150.141 kg)  BMI 48.86 kg/m2 Last Weight:  Wt Readings from Last 1 Encounters:  01/07/15 331 lb (150.141 kg)   Last Height:   Ht Readings from  Last 1 Encounters:  01/07/15  (1.753 m)    Exam: Gen: NAD, conversant, well nourised, morbidly obese(BMI 47.3), well groomed  CV: RRR, no MRG. No Carotid Bruits. No peripheral edema, warm, nontender Eyes: Conjunctivae clear without exudates or hemorrhage  Neuro: Detailed Neurologic Exam  Speech:  Speech is normal; fluent and spontaneous with normal comprehension.  Cognition:  The patient is oriented to person, place, and time;   recent and remote memory intact;   language fluent;  normal attention, concentration,   fund of knowledge Cranial Nerves:  The pupils are equal, round, and reactive to light. Fundi flat. Left homonomous hemianopia - improved, today as VF intact to finger count. Extraocular movements are intact. Trigeminal sensation is intact and the muscles of mastication are normal. The face is symmetric. The palate elevates in the midline. Hearing intact. Voice is normal. Shoulder shrug is normal. The tongue has normal motion without fasciculations.   Coordination:  No dysmetria   Gait:  No ataxia  Motor Observation:  No asymmetry, no atrophy, and no involuntary movements noted. Tone:  Normal muscle tone.   Posture:  Posture is normal. normal erect   Strength:  Strength is V/V in the upper and lower limbs.     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 Vision improved today. MRI of the brain normal. I ordered LP due to mild edema of the fundi, normal opening pressure, with normal WBCs when adjusted for RBCs, elevated protein of 400 (RBCs 32000) which is still high even when adjusted for RBCs.  Elevated protein in the csf could be secondary to uncontrolled diabetes. Unlikely other causes (MS, brain tumor, brain abscess, meningitis) given normal brain  mri. Also could be due to spinal cord lesion however presenting symptom is visual loss.  Considering that workup so far is normal, could be a migraine disorder. The persistent vision loss is unusual but has been well documented. His vision is improving, today visual fields are intact to finger count.  Continue Verapamail 240mg  daily. Can increase to 360 if needed. Will add Trokendi.  Daily ASA for stroke prevention  Needs to follow closely with pcp to manage vascular risk factors  Needs sleep study, morbidly obese with diagnosed OSA years ago and has not used cpap. Sleep study ordered.   Discussed compliance with cpap and medications.   Naomie DeanAntonia Ahern, MD  [facility] [address] [place], [place] [id number]  Phone [phone] Fax [phone]  A total of 30 minutes was spent face-to-face with this patient. Over half this time was spent on counseling patient on the migraine diagnosis and different diagnostic and therapeutic options available.

## 2015-01-08 DIAGNOSIS — H539 Unspecified visual disturbance: Secondary | ICD-10-CM | POA: Insufficient documentation

## 2015-01-10 ENCOUNTER — Telehealth: Payer: Self-pay | Admitting: Neurology

## 2015-01-10 NOTE — Telephone Encounter (Signed)
Patient is calling as he needs a letter to say he can go back to work.  Patient works for the  [employer], fax [phone].  Thanks!

## 2015-01-10 NOTE — Telephone Encounter (Signed)
Christopher Solomon - Would you let patient know that I [facility] to [facility]. Since patient's last Visual Field testing showed significant vision loss, Dr. Lorin PicketScott agrees with me that we should repeat the visual field testing and make sure the visual field testing reflects his improvement in vision that we saw in the office. If so, then I can write him a letter for work that he needs, to remove restrictions so he can drive again. Thanks.

## 2015-01-10 NOTE — Telephone Encounter (Signed)
Spoke with patient and he has an appointment with [facility] at [facility] on 01/11/15 at 8:15 am. He wanted to clarify with Dr. Lucia GaskinsAhern that he wanted a dr. Note to go back to work but he would have someone else drive him to and from work. I told him I would check with Dr. Lucia GaskinsAhern and give him a call back.

## 2015-01-11 ENCOUNTER — Encounter: Payer: Self-pay | Admitting: Neurology

## 2015-01-11 ENCOUNTER — Telehealth: Payer: Self-pay | Admitting: *Deleted

## 2015-01-11 NOTE — Telephone Encounter (Signed)
Just sent note to printer, thanks

## 2015-01-11 NOTE — Telephone Encounter (Signed)
I faxed letter from Dr. Lucia GaskinsAhern to number pt provided at 10:32 am on 01/11/15.

## 2015-01-27 NOTE — Telephone Encounter (Signed)
Wants results from test he had and reference to going to back to work please call him @ [phone]

## 2015-01-27 NOTE — Telephone Encounter (Signed)
His visual field looked very good. I am happy to lift all restrictions. Let me know if he would like a letter and where to fax it, thanks

## 2015-01-28 ENCOUNTER — Telehealth: Payer: Self-pay | Admitting: Neurology

## 2015-01-28 NOTE — Telephone Encounter (Signed)
Patient is calling regarding the visual field test that was done and when the patient can go back to work full duty. Please call. Thank you.

## 2015-01-28 NOTE — Telephone Encounter (Signed)
Talked with Christopher Solomon to let him know visual field test results looked normal and Dr. Lucia GaskinsAhern will lift all restrictions so he can go back to work. I told him she will write a letter for him. He stated he will call me back with the fax number.

## 2015-01-31 ENCOUNTER — Telehealth: Payer: Self-pay | Admitting: *Deleted

## 2015-01-31 ENCOUNTER — Encounter: Payer: Self-pay | Admitting: Neurology

## 2015-01-31 NOTE — Telephone Encounter (Signed)
Patient is calling with fax number for letter. Number is [phone].

## 2015-01-31 NOTE — Telephone Encounter (Signed)
Letter is on your desk, please fax thank you

## 2015-01-31 NOTE — Telephone Encounter (Signed)
Spouse called to give a second fax # [phone] for letter.

## 2015-01-31 NOTE — Telephone Encounter (Signed)
Called and talked with wife. She said to have the letter sent for patient sent to the first fax number she provided us. That was [phone].

## 2015-02-01 ENCOUNTER — Telehealth: Payer: Self-pay | Admitting: *Deleted

## 2015-02-01 NOTE — Telephone Encounter (Signed)
Called and spoke with [name] to let her know I faxed over letter for patient. I told her to call back if there are any problems. She verbalized understanding.

## 2015-02-04 ENCOUNTER — Ambulatory Visit (INDEPENDENT_AMBULATORY_CARE_PROVIDER_SITE_OTHER): Payer: 59 | Admitting: Neurology

## 2015-02-04 DIAGNOSIS — G4730 Sleep apnea, unspecified: Secondary | ICD-10-CM | POA: Diagnosis not present

## 2015-02-04 DIAGNOSIS — Z6841 Body Mass Index (BMI) 40.0 and over, adult: Secondary | ICD-10-CM

## 2015-02-04 DIAGNOSIS — E662 Morbid (severe) obesity with alveolar hypoventilation: Secondary | ICD-10-CM

## 2015-02-05 NOTE — Sleep Study (Signed)
Please see the scanned sleep study interpretation located in the Procedure tab within the Chart Review section. 

## 2015-02-10 ENCOUNTER — Ambulatory Visit: Payer: 59 | Admitting: Neurology

## 2015-02-23 ENCOUNTER — Encounter: Payer: Self-pay | Admitting: Neurology

## 2015-02-23 ENCOUNTER — Ambulatory Visit (INDEPENDENT_AMBULATORY_CARE_PROVIDER_SITE_OTHER): Payer: 59 | Admitting: Neurology

## 2015-02-23 ENCOUNTER — Telehealth: Payer: Self-pay | Admitting: Neurology

## 2015-02-23 VITALS — BP 148/84 | HR 95 | Temp 98.7°F | Ht 69.0 in | Wt 335.0 lb

## 2015-02-23 DIAGNOSIS — G4733 Obstructive sleep apnea (adult) (pediatric): Secondary | ICD-10-CM

## 2015-02-23 DIAGNOSIS — G43101 Migraine with aura, not intractable, with status migrainosus: Secondary | ICD-10-CM

## 2015-02-23 NOTE — Telephone Encounter (Signed)
Pt came by office stating he has not received result from sleep study performed. Pt is also concerned with payment applied to study. Please advise.

## 2015-02-23 NOTE — Progress Notes (Addendum)
[facility]    Provider:  Dr Lucia GaskinsAhern Referring Provider: Catalina PizzaHall, Zach, MD Primary Care Physician:  Catalina PizzaHALL, ZACH, MD  CC: Migraines and vision loss  HPI: Christopher Solomon is a 34 y.o. male here as a follow up. PMHx of HTN, DM, HLD, morbid obesity.  Today patient reports that his vision has improved, he no longer has any visual deficits. His headaches have also improved. He is still on the verapamil and not experiencing any side effects. He had a sleep study 2 weeks ago and he has not heard back. He does remember that they have a split night study and his CPAP settings were similar to his previous CPAP settings. Discussed objective sleep apnea, and compliance with CPAP, and the repercussions of untreated sleep apnea which includes headaches, pulmonary hypertension, hypertension, and increased risk of stroke.  01/07/2015. His vision is improving. He is still seeing "lightning" on the left side of his vision. That is improving also. He can see on the left now. He has a follow up appointment for sleep study on April 15th. The headaches are getting better, worse in the morning and then later in the evenings after work they also get worse. Headache is every other day until he closes his eyes and lays down, could last all day. +light sensitivity, +sound sensitivity, constant pressure, has to go into a dark room. +nausea. He has not been getting as much of the nausea.   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 12/29/2014.   Initial visit 12/22/2014: Christopher Solomon is a 34 y.o. male here as a referral from Dr. Margo AyeHall for vision loss.  PMHx of HTN, DM, HLD, morbid obesity. Symptoms started 3 weeks ago. He has constant headache. He would get blind spot in the left eye, a squiggly floater, peripheral vision is gone in the left eye, he has lost vision. He has been to an ophthalmologic and a retinal specialist and everything normal. Dr. Peggye Pittichards is the retina specialist, Dr.  Ronney AstersJohn Scott [facility]. They did all kinds of testing. It is grey on the left side. He is having episodes of light coming on the left periphery. The headaches on the right side of the head and into the neck. He has a lot of ear popping. He has been evaluated by ENT. He has a history of tension headaches. He has sleep apnea and has not used the CPAP in years. He has snoring, excessive daytime fatigue. Persistent for 3 weeks, 5-10/10 pain. +light sensitivity, +sound sensitivity, constant pressure, has to go into a dark room. +nausea. The vision hasn't come back yet. Vision loss happened a week ago and hasn't come back yet. He was tried on topamax for the tension headaches not for the migraines, never had a migraine below.   Reviewed notes, labs and imaging from outside physicians, which showed:  Notes from [facility]  OD 20/30 OS 20/40 PERRL, no APD, slit lamp normal, fundoscopic exam PS CSCR A1c 11  MRI of the brain normal, personally reviewed images CBC WNL BMP with elevated glucose (324)    Review of Systems: Patient complains of symptoms per HPI as well as the following symptoms: No shortness of breath, no chest pain. Pertinent negatives per HPI. All others negative.   History   Social History  . Marital Status: Married    Spouse Name: Wyatt Mageabitha  . Number of Children: 1  . Years of Education: 12th grade   Occupational History  . Street Teaching laboratory technicianMaintenance Supervisor  [employer]   Social History Main Topics  . Smoking status: Former Smoker    Quit date: 10/23/1999  . Smokeless tobacco: Never Used  . Alcohol Use: No  . Drug Use: No  . Sexual Activity: Not on file   Other Topics Concern  . Not on file   Social History Narrative   Lives with his wife and son.   Caffeine: 2 soft drinks/ day   Occasional coffee use    Family History  Problem Relation Age of Onset  . Diabetes Paternal Grandfather   . Cancer Paternal Grandmother   . Lung cancer  Maternal Grandmother     Past Medical History  Diagnosis Date  . Diabetes mellitus without complication   . Hypertension   . High cholesterol   . Headaches, cluster     tension    Past Surgical History  Procedure Laterality Date  . No surgical history      Current Outpatient Prescriptions  Medication Sig Dispense Refill  . ALPRAZolam (XANAX) 0.5 MG tablet Take 0.5 mg by mouth at bedtime as needed for anxiety.    . Diclofenac Potassium 50 MG PACK Take 50 mg by mouth once as needed. Take once daily as needed with headache onset. Please take with food 30 each 6  . fluticasone (FLONASE) 50 MCG/ACT nasal spray Place 2 sprays into both nostrils as needed.    . hydrochlorothiazide (MICROZIDE) 12.5 MG capsule Take 12.5 mg by mouth daily.    Marland Kitchen HYDROcodone-acetaminophen (NORCO/VICODIN) 5-325 MG per tablet Take 1 tablet by mouth as needed.    Marland Kitchen ibuprofen (ADVIL,MOTRIN) 800 MG tablet Take 800 mg by mouth every 8 (eight) hours as needed.    . insulin aspart protamine- aspart (NOVOLOG MIX 70/30) (70-30) 100 UNIT/ML injection Inject 20-35 Units into the skin 2 (two) times daily with a meal.     . insulin detemir (LEVEMIR) 100 UNIT/ML injection Inject 80 Units into the skin at bedtime.     Marland Kitchen losartan (COZAAR) 100 MG tablet Take 100 mg by mouth daily.    Marland Kitchen NOVOLOG FLEXPEN 100 UNIT/ML FlexPen     . ondansetron (ZOFRAN ODT) 4 MG disintegrating tablet Take 1 tablet (4 mg total) by mouth every 8 (eight) hours as needed for nausea or vomiting. 60 tablet 3  . ondansetron (ZOFRAN) 4 MG tablet Take 4 mg by mouth every 8 (eight) hours as needed for nausea or vomiting.    . simvastatin (ZOCOR) 20 MG tablet Take 20 mg by mouth daily.    . Testosterone 25 MG/2.5GM (1%) GEL Place 25 mg onto the skin daily.    Marland Kitchen tiZANidine (ZANAFLEX) 4 MG tablet Take 4 mg by mouth every 6 (six) hours as needed for muscle spasms.    . Topiramate ER 100 MG CP24 Take 100 mg by mouth at bedtime. 30 capsule 6  . verapamil (VERELAN  PM) 240 MG 24 hr capsule Take 1 capsule (240 mg total) by mouth at bedtime. 30 capsule 6   No current facility-administered medications for this visit.    Allergies as of 02/23/2015 - Review Complete 02/23/2015  Allergen Reaction Noted  . Reglan [metoclopramide] Other (See Comments) 12/22/2014  . Codeine Nausea And Vomiting 12/12/2014    Vitals: BP 148/84 mmHg  Pulse 95  Temp(Src) 98.7 F (37.1 C)  Ht  (1.753 m)  Wt 335 lb (151.955 kg)  BMI 49.45 kg/m2 Last Weight:  Wt Readings from Last 1 Encounters:  02/23/15 335 lb (151.955 kg)  Last Height:   Ht Readings from Last 1 Encounters:  02/23/15 5\' 9"  (1.753 m)   Physical exam: Exam: Gen: NAD, conversant, well nourised, obese, well groomed                     Speech:    Speech is normal; fluent and spontaneous with normal comprehension.  Cognition:    The patient is oriented to person, place, and time;     recent and remote memory intact;     language fluent;     normal attention, concentration,     fund of knowledge Cranial Nerves:    The pupils are equal, round, and reactive to light. The fundi are normal and spontaneous venous pulsations are present. Visual fields are full to finger confrontation. Extraocular movements are intact. Trigeminal sensation is intact and the muscles of mastication are normal. The face is symmetric. The palate elevates in the midline. Hearing intact. Voice is normal. Shoulder shrug is normal. The tongue has normal motion without fasciculations.        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 Vision defects  resolved today. MRI of the brain normal. I ordered LP due to mild edema of the fundi, normal opening pressure, with normal WBCs when adjusted for RBCs, elevated protein of 400 (RBCs 32000) which is still high even when  adjusted for RBCs.  Elevated protein in the csf could be secondary to uncontrolled diabetes. Unlikely other causes (MS, brain tumor, brain abscess, meningitis) given normal brain mri. Also could be due to spinal cord lesion however presenting symptom is visual loss.  Considering that workup so far is normal, could be a migraine disorder. The persistent vision loss is unusual but has been well documented. His vision is improving, today visual fields are intact to finger count.  Continue Verapamail 240mg  daily. Can increase to 360 if needed. Will add Trokendi.  Daily ASA for stroke prevention  Needs to follow closely with pcp to manage vascular risk factors  sleep study completed, morbidly obese with diagnosed OSA years ago and has not used cpap. Sleep study ordered.   Discussed compliance with cpap and medications.     Naomie DeanAntonia Ahern, MD  [facility] [address] [place], [place] [id number]  Phone [phone] Fax [phone]  A total of 15 minutes was spent face-to-face with this patient. Over half this time was spent on counseling patient on the migraine and sleep apnea diagnosis and different diagnostic and therapeutic options available.

## 2015-02-25 NOTE — Telephone Encounter (Signed)
i need the day of service.

## 2015-03-01 ENCOUNTER — Telehealth: Payer: Self-pay | Admitting: Neurology

## 2015-03-01 ENCOUNTER — Other Ambulatory Visit: Payer: Self-pay | Admitting: Neurology

## 2015-03-01 DIAGNOSIS — G43101 Migraine with aura, not intractable, with status migrainosus: Secondary | ICD-10-CM

## 2015-03-01 MED ORDER — TOPIRAMATE ER 100 MG PO CAP24: 100.0000 mg | ORAL_CAPSULE | Freq: Every day | ORAL | Status: DC

## 2015-03-01 NOTE — Telephone Encounter (Signed)
Called to let patient know Dr. Lucia GaskinsAhern is reordering the topiramate 100mg  and we will check into the sleep study results. I told pt to call pharmacy to make sure Rx is there. Pt verbalized understanding.

## 2015-03-01 NOTE — Telephone Encounter (Signed)
Pt. Called and requested to speak with Kara MeadEmma RN regarding sleep study results and medication. Please call and advise.

## 2015-03-02 ENCOUNTER — Telehealth: Payer: Self-pay | Admitting: Neurology

## 2015-03-02 NOTE — Telephone Encounter (Signed)
Thanks, let me know this is the second time he asked me for results. If you haven't read it no problem, just please give us a time frame so we can let him know.

## 2015-03-02 NOTE — Telephone Encounter (Signed)
Spoke to patient regarding sleep study tests. He has severe sleep apnea and we recommend 11cmH20. Patient would like his CPAP machine set.   Kara Meadmma - can you please ask dr. Oliva Bustardohmeier's nurse to help patient get this taken care of? They do it all the time for the sleep lab, I'm not sure how myself. Thank you.

## 2015-03-02 NOTE — Telephone Encounter (Signed)
I think, he was on Dr. Ashok Cordia's read list. Let me forward to her.

## 2015-03-03 ENCOUNTER — Telehealth: Payer: Self-pay

## 2015-03-03 DIAGNOSIS — G4733 Obstructive sleep apnea (adult) (pediatric): Secondary | ICD-10-CM

## 2015-03-03 NOTE — Telephone Encounter (Signed)
Spoke with Baxter HireKristen, Dr. Vickey Hugerohmeier nurse about calling pt to getting settings for CPAP reset. She said she will call patient and speak with them.

## 2015-03-03 NOTE — Telephone Encounter (Signed)
Called pt to give sleep study results. Informed pt that his sleep study showed severe osa and that to restart his cpap at 11 cm H2O was advised. Pt wishes to proceed. Advised pt to wear his cpap at least four hours a night and to bring in his cpap for appts here at [facility]. Pt does not wish to make a f/u appt at this time, even though i advised him that insurance requires a f/u 30-60 days after starting a cpap. He said he would call and make an appt after he started his cpap.

## 2015-03-08 DIAGNOSIS — Z6841 Body Mass Index (BMI) 40.0 and over, adult: Secondary | ICD-10-CM

## 2015-03-08 DIAGNOSIS — E662 Morbid (severe) obesity with alveolar hypoventilation: Secondary | ICD-10-CM | POA: Insufficient documentation

## 2015-03-30 ENCOUNTER — Telehealth: Payer: Self-pay

## 2015-03-30 NOTE — Telephone Encounter (Signed)
Called pt to set up f/u appt for cpap. He says he has not heard from [facility] yet. Called [facility] to follow up on the status but got the answering service. Will call again during business hours to follow up.

## 2015-04-07 ENCOUNTER — Telehealth: Payer: Self-pay

## 2015-04-07 NOTE — Telephone Encounter (Signed)
Called to follow up on cpap and make a f/u appt for pt. No answer, left message on cell phone asking him to call me back.

## 2015-04-07 NOTE — Telephone Encounter (Signed)
[facility] contacted me and said they had received the order for cpap and were waiting on the pt to call them back after they left him a message.

## 2015-04-18 ENCOUNTER — Other Ambulatory Visit: Payer: Self-pay

## 2015-04-28 ENCOUNTER — Telehealth: Payer: Self-pay

## 2015-04-28 NOTE — Telephone Encounter (Signed)
Calling to follow up on pt's cpap. He has not called me to set up a f/u appt. Left a message on mobile asking him to call me back. He just needs a 30 minute follow up 30-60 days after the day he started his cpap.

## 2015-08-15 ENCOUNTER — Other Ambulatory Visit: Payer: Self-pay | Admitting: Neurology

## 2015-09-02 ENCOUNTER — Encounter (HOSPITAL_COMMUNITY): Payer: 59

## 2015-09-13 ENCOUNTER — Other Ambulatory Visit: Payer: Self-pay | Admitting: Neurology

## 2015-09-13 ENCOUNTER — Telehealth: Payer: Self-pay

## 2015-09-13 NOTE — Telephone Encounter (Signed)
Hi Dr Lucia GaskinsAhern!  It appears you prescribed Cambia in March for this patient.  The pharmacy sent a refill request today for Diclofenac tabs.  I contacted them, and they mistook the original Rx for Diclofenac tabs instead of packet, so patient has been taking tabs this entire time.  They also note ins will not pay for Cambia.  I will be happy to send refills on tabs, if permissible to continue them instead of Cambia.  Thanks!

## 2015-09-13 NOTE — Telephone Encounter (Signed)
Thank you!  Rx has ben sent.

## 2015-09-13 NOTE — Telephone Encounter (Signed)
Sure, if he wants the diclofenac tabs that is fine thanks!

## 2016-01-15 ENCOUNTER — Other Ambulatory Visit: Payer: Self-pay | Admitting: Neurology

## 2016-01-16 ENCOUNTER — Other Ambulatory Visit: Payer: Self-pay | Admitting: Nurse Practitioner

## 2016-01-16 ENCOUNTER — Ambulatory Visit
Admission: RE | Admit: 2016-01-16 | Discharge: 2016-01-16 | Disposition: A | Discharge status: Home / Self Care | Payer: 59 | Source: Ambulatory Visit | Attending: Nurse Practitioner | Admitting: Nurse Practitioner

## 2016-01-16 DIAGNOSIS — M545 Low back pain: Secondary | ICD-10-CM

## 2016-01-16 DIAGNOSIS — M25551 Pain in right hip: Secondary | ICD-10-CM

## 2016-01-23 NOTE — Telephone Encounter (Signed)
Dr Lucia GaskinsAhern- are you ok to refill ondansetron for pt? Please advise

## 2016-05-18 ENCOUNTER — Other Ambulatory Visit: Payer: Self-pay | Admitting: Neurology

## 2016-05-22 ENCOUNTER — Other Ambulatory Visit: Payer: Self-pay | Admitting: Neurology

## 2016-08-20 ENCOUNTER — Other Ambulatory Visit: Payer: Self-pay | Admitting: Neurology

## 2017-03-29 DIAGNOSIS — E113211 Type 2 diabetes mellitus with mild nonproliferative diabetic retinopathy with macular edema, right eye: Secondary | ICD-10-CM | POA: Diagnosis not present

## 2017-06-14 DIAGNOSIS — R0602 Shortness of breath: Secondary | ICD-10-CM | POA: Diagnosis not present

## 2017-06-14 DIAGNOSIS — I10 Essential (primary) hypertension: Secondary | ICD-10-CM | POA: Diagnosis not present

## 2017-06-21 DIAGNOSIS — E119 Type 2 diabetes mellitus without complications: Secondary | ICD-10-CM | POA: Diagnosis not present

## 2017-06-21 DIAGNOSIS — E299 Testicular dysfunction, unspecified: Secondary | ICD-10-CM | POA: Diagnosis not present

## 2017-06-21 DIAGNOSIS — I10 Essential (primary) hypertension: Secondary | ICD-10-CM | POA: Diagnosis not present

## 2017-06-28 DIAGNOSIS — R0602 Shortness of breath: Secondary | ICD-10-CM | POA: Diagnosis not present

## 2017-06-28 DIAGNOSIS — J302 Other seasonal allergic rhinitis: Secondary | ICD-10-CM | POA: Diagnosis not present

## 2017-06-28 DIAGNOSIS — Z23 Encounter for immunization: Secondary | ICD-10-CM | POA: Diagnosis not present

## 2017-06-28 DIAGNOSIS — I10 Essential (primary) hypertension: Secondary | ICD-10-CM | POA: Diagnosis not present

## 2017-06-28 DIAGNOSIS — Z0001 Encounter for general adult medical examination with abnormal findings: Secondary | ICD-10-CM | POA: Diagnosis not present

## 2017-07-01 ENCOUNTER — Other Ambulatory Visit: Payer: Self-pay | Admitting: Internal Medicine

## 2017-07-01 ENCOUNTER — Ambulatory Visit
Admission: RE | Admit: 2017-07-01 | Discharge: 2017-07-01 | Disposition: A | Discharge status: Home / Self Care | Payer: 59 | Source: Ambulatory Visit | Attending: Internal Medicine | Admitting: Internal Medicine

## 2017-07-01 DIAGNOSIS — R0602 Shortness of breath: Secondary | ICD-10-CM | POA: Diagnosis not present

## 2017-09-27 DIAGNOSIS — R05 Cough: Secondary | ICD-10-CM | POA: Diagnosis not present

## 2017-09-27 DIAGNOSIS — J019 Acute sinusitis, unspecified: Secondary | ICD-10-CM | POA: Diagnosis not present

## 2017-10-12 DIAGNOSIS — H9201 Otalgia, right ear: Secondary | ICD-10-CM | POA: Diagnosis not present

## 2017-12-25 DIAGNOSIS — I10 Essential (primary) hypertension: Secondary | ICD-10-CM | POA: Diagnosis not present

## 2017-12-25 DIAGNOSIS — K589 Irritable bowel syndrome without diarrhea: Secondary | ICD-10-CM | POA: Diagnosis not present

## 2017-12-25 DIAGNOSIS — E119 Type 2 diabetes mellitus without complications: Secondary | ICD-10-CM | POA: Diagnosis not present

## 2017-12-27 DIAGNOSIS — E119 Type 2 diabetes mellitus without complications: Secondary | ICD-10-CM | POA: Diagnosis not present

## 2018-04-03 ENCOUNTER — Encounter (HOSPITAL_COMMUNITY): Payer: Self-pay | Admitting: Emergency Medicine

## 2018-04-03 ENCOUNTER — Encounter (HOSPITAL_COMMUNITY): Payer: Self-pay

## 2018-04-03 ENCOUNTER — Emergency Department (HOSPITAL_COMMUNITY)
Admission: EM | Admit: 2018-04-03 | Discharge: 2018-04-03 | Disposition: A | Discharge status: Home / Self Care | Payer: 59 | Attending: Emergency Medicine | Admitting: Emergency Medicine

## 2018-04-03 ENCOUNTER — Emergency Department (HOSPITAL_COMMUNITY): Payer: 59

## 2018-04-03 ENCOUNTER — Ambulatory Visit (HOSPITAL_COMMUNITY)
Admission: EM | Admit: 2018-04-03 | Discharge: 2018-04-03 | Disposition: A | Discharge status: Home / Self Care | Payer: 59 | Source: Home / Self Care | Attending: Family Medicine | Admitting: Family Medicine

## 2018-04-03 DIAGNOSIS — Z79899 Other long term (current) drug therapy: Secondary | ICD-10-CM | POA: Diagnosis not present

## 2018-04-03 DIAGNOSIS — R519 Headache, unspecified: Secondary | ICD-10-CM

## 2018-04-03 DIAGNOSIS — I10 Essential (primary) hypertension: Secondary | ICD-10-CM | POA: Insufficient documentation

## 2018-04-03 DIAGNOSIS — G43909 Migraine, unspecified, not intractable, without status migrainosus: Secondary | ICD-10-CM | POA: Insufficient documentation

## 2018-04-03 DIAGNOSIS — R079 Chest pain, unspecified: Secondary | ICD-10-CM | POA: Diagnosis not present

## 2018-04-03 DIAGNOSIS — Z87891 Personal history of nicotine dependence: Secondary | ICD-10-CM | POA: Insufficient documentation

## 2018-04-03 DIAGNOSIS — R200 Anesthesia of skin: Secondary | ICD-10-CM

## 2018-04-03 DIAGNOSIS — Z794 Long term (current) use of insulin: Secondary | ICD-10-CM | POA: Insufficient documentation

## 2018-04-03 DIAGNOSIS — R51 Headache: Secondary | ICD-10-CM

## 2018-04-03 DIAGNOSIS — E119 Type 2 diabetes mellitus without complications: Secondary | ICD-10-CM | POA: Diagnosis not present

## 2018-04-03 LAB — DIFFERENTIAL
Abs Immature Granulocytes: 0 10*3/uL (ref 0.0–0.1)
Basophils Absolute: 0 10*3/uL (ref 0.0–0.1)
Basophils Relative: 1 %
Eosinophils Absolute: 0.3 10*3/uL (ref 0.0–0.7)
Eosinophils Relative: 3 %
Immature Granulocytes: 0 %
Lymphocytes Relative: 30 %
Lymphs Abs: 2.3 10*3/uL (ref 0.7–4.0)
Monocytes Absolute: 0.7 10*3/uL (ref 0.1–1.0)
Monocytes Relative: 9 %
Neutro Abs: 4.5 10*3/uL (ref 1.7–7.7)
Neutrophils Relative %: 57 %

## 2018-04-03 LAB — COMPREHENSIVE METABOLIC PANEL
ALT: 31 U/L (ref 17–63)
AST: 42 U/L — ABNORMAL HIGH (ref 15–41)
Albumin: 4.4 g/dL (ref 3.5–5.0)
Alkaline Phosphatase: 70 U/L (ref 38–126)
Anion gap: 13 (ref 5–15)
BUN: 16 mg/dL (ref 6–20)
CO2: 27 mmol/L (ref 22–32)
Calcium: 9.8 mg/dL (ref 8.9–10.3)
Chloride: 98 mmol/L — ABNORMAL LOW (ref 101–111)
Creatinine, Ser: 0.96 mg/dL (ref 0.61–1.24)
GFR calc Af Amer: >60 mL/min (ref >60)
GFR calc non Af Amer: >60 mL/min (ref >60)
Glucose, Bld: 208 mg/dL — ABNORMAL HIGH (ref 65–99)
Potassium: 4.3 mmol/L (ref 3.5–5.1)
Sodium: 138 mmol/L (ref 135–145)
Total Bilirubin: 1.3 mg/dL — ABNORMAL HIGH (ref 0.3–1.2)
Total Protein: 7.2 g/dL (ref 6.5–8.1)

## 2018-04-03 LAB — I-STAT TROPONIN, ED: Troponin i, poc: 0 ng/mL (ref 0.00–0.08)

## 2018-04-03 LAB — CBC
HCT: 51.7 % (ref 39.0–52.0)
Hemoglobin: 17.6 g/dL — ABNORMAL HIGH (ref 13.0–17.0)
MCH: 31.6 pg (ref 26.0–34.0)
MCHC: 34 g/dL (ref 30.0–36.0)
MCV: 92.8 fL (ref 78.0–100.0)
Platelets: 185 10*3/uL (ref 150–400)
RBC: 5.57 MIL/uL (ref 4.22–5.81)
RDW: 12.4 % (ref 11.5–15.5)
WBC: 7.8 10*3/uL (ref 4.0–10.5)

## 2018-04-03 LAB — APTT: aPTT: 28 seconds (ref 24–36)

## 2018-04-03 LAB — PROTIME-INR
INR: 0.89
Prothrombin Time: 12 seconds (ref 11.4–15.2)

## 2018-04-03 MED ORDER — PROMETHAZINE HCL 25 MG PO TABS
25.0000 mg | ORAL_TABLET | Freq: Once | ORAL | Status: AC
  Administered 2018-04-03: 25 mg via ORAL
  Filled 2018-04-03: qty 1

## 2018-04-03 MED ORDER — KETOROLAC TROMETHAMINE 30 MG/ML IJ SOLN
30.0000 mg | Freq: Once | INTRAMUSCULAR | Status: AC
  Administered 2018-04-03: 30 mg via INTRAMUSCULAR
  Filled 2018-04-03: qty 1

## 2018-04-03 NOTE — ED Provider Notes (Signed)
[facility]   [id number] 04/03/18 Arrival Time: 1518  ASSESSMENT & PLAN:  1. Bad headache   2. Arm numbness left   3. Left facial numbness    Mr Christopher Solomon is not comfortable with treatment of headache here and observing for further symptoms overnight. Prefers ED evaluation. Stable upon D/C.  Reviewed expectations re: course of current medical issues. Questions answered. Outlined signs and symptoms indicating need for more acute intervention. Patient verbalized understanding. After Visit Summary given.   SUBJECTIVE:  Christopher SaxonRonald D Fagerstrom is a 37 y.o. male who presents with complaint of a a persistent headache for the past 4 days. Worsening. Earlier today describes transient L facial and arm 'numbness'. Lasted several minutes then resolved. No further occurrence. Also feels 'vision a little off' today. Is followed by neurology for frequent tension headaches. Feels this headache is somewhat different from his usual headaches. Ambulatory without difficulty. No dysequilibrium. No aphasia or ataxia. No extremity sensation changes or weakness currently. No head injury/trauma. No new medications. OTC analgesics without relief. No recent illnesses.  ROS: As per HPI.   OBJECTIVE:  Vitals:   04/03/18 1533  BP: (!) 143/87  Pulse: 99  Resp: 18  Temp: 98.6 F (37 C)  SpO2: 100%    General appearance: alert; no distress Eyes: PERRLA; EOMI; conjunctiva normal HENT: normocephalic; atraumatic Neck: supple with FROM Lungs: unlabored respirations Heart: regular rate and rhythm Extremities: no edema; symmetrical with no gross deformities Skin: warm and dry Neurologic: CN 2-12 grossly intact; normal gait; normal extremity strength and sensation throughout Psychological: alert and cooperative; normal mood and affect   Allergies  Allergen Reactions  . Reglan [Metoclopramide] Other (See Comments)    Makes him very agitated; can't stop moving.  . Codeine Nausea And Vomiting    Past  Medical History:  Diagnosis Date  . Diabetes mellitus without complication (HCC)   . Headaches, cluster    tension  . High cholesterol   . Hypertension    Social History   Socioeconomic History  . Marital status: Married    Spouse name: Christopher Solomon  . Number of children: 1  . Years of education: 12th grade  . Highest education level: Not on file  Occupational History  . Occupation: Surveyor, miningtreet Maintenance Supervisor    Comment: [employer] [place]  Social Needs  . Financial resource strain: Not on file  . Food insecurity:    Worry: Not on file    Inability: Not on file  . Transportation needs:    Medical: Not on file    Non-medical: Not on file  Tobacco Use  . Smoking status: Former Smoker    Last attempt to quit: 10/23/1999    Years since quitting: 18.4  . Smokeless tobacco: Never Used  Substance and Sexual Activity  . Alcohol use: No    Alcohol/week: 0.0 oz  . Drug use: No  . Sexual activity: Not on file  Lifestyle  . Physical activity:    Days per week: Not on file    Minutes per session: Not on file  . Stress: Not on file  Relationships  . Social connections:    Talks on phone: Not on file    Gets together: Not on file    Attends religious service: Not on file    Active member of club or organization: Not on file    Attends meetings of clubs or organizations: Not on file    Relationship status: Not on file  . Intimate partner violence:  Fear of current or ex partner: Not on file    Emotionally abused: Not on file    Physically abused: Not on file    Forced sexual activity: Not on file  Other Topics Concern  . Not on file  Social History Narrative   Lives with his wife and son.   Caffeine: 2 soft drinks/ day   Occasional coffee use   Family History  Problem Relation Age of Onset  . Diabetes Paternal Grandfather   . Cancer Paternal Grandmother   . Lung cancer Maternal Grandmother    Past Surgical History:  Procedure Laterality Date  . no surgical history        Christopher Layman, MD 04/03/18 [phone]

## 2018-04-03 NOTE — ED Notes (Signed)
Called to recheck vitals- Pt in CT 

## 2018-04-03 NOTE — ED Triage Notes (Signed)
Pt c/o headache x4 days, states yesterday he had numbness around the left side of his face and his arm. Numbness is gone. Pt also felt short of breath earlier denies it at this time. Dr. Tracie HarrierHagler aware, okay to be seen here.

## 2018-04-03 NOTE — Discharge Instructions (Addendum)
Please read attached information. If you experience any new or worsening signs or symptoms please return to the emergency room for evaluation. Please follow-up with your primary care provider or specialist as discussed.  °

## 2018-04-03 NOTE — ED Triage Notes (Addendum)
Patient complains of 4 days of headache and has had some intermittent blurred vision. Reports face numbness yesterday but none now, denies trauma. Nausea with same and complains of a feeling of shortness of breath. Reports intermittent chronic headaches but states that this headache is different

## 2018-04-03 NOTE — ED Provider Notes (Signed)
[facility] EMERGENCY DEPARTMENT Provider Note   CSN: [id number] Arrival date & time: 04/03/18  1606     History   Chief Complaint No chief complaint on file.   HPI Christopher Solomon is a 37 y.o. male.  HPI   38 year old male presents today with complaints of headache.  Patient notes 4 days ago he developed a general headache that he describes as bndlike with a throbbing sensation.  He notes this was localized to the frontal region.  Patient notes that he has a history of comp gated migraines and is currently followed by neurology.  He has had significant vision changes with his migraines in the past.  She notes yesterday he had 6 minutes of numbness in his left arm and face that resolved without residual deficits.  He denies any acute weakness associated with this, denies any lower extremity changes, or any visual changes.  Patient notes at the same time he had an episode of chest pressure and shortness of breath that resolved after the episode.  Patient denies any continuation of symptoms.  Patient notes at the time of my evaluation he continues to have a headache, no other complaints here.  He was seen in urgent care prior to evaluation here in the ED.  Patient reports he is on Zanaflex at home without improvement in symptoms.    Past Medical History:  Diagnosis Date  . Diabetes mellitus without complication (HCC)   . Headaches, cluster    tension  . High cholesterol   . Hypertension     Patient Active Problem List   Diagnosis Date Noted  . Morbid obesity with BMI of 40.0-44.9, adult (HCC) 03/08/2015  . Obesity hypoventilation syndrome (HCC) 03/08/2015  . Visual aura 01/08/2015  . Migraine with aura and with status migrainosus 12/22/2014  . Morbid obesity (HCC) 12/22/2014  . OSA (obstructive sleep apnea) 12/22/2014  . Diabetes type 2, controlled (HCC) 12/12/2014  . HTN (hypertension) 12/12/2014  . Hyperlipidemia 12/12/2014  . BMI 45.0-49.9, adult (HCC)  12/12/2014    Past Surgical History:  Procedure Laterality Date  . no surgical history          Home Medications    Prior to Admission medications   Medication Sig Start Date End Date Taking? Authorizing Provider  ALPRAZolam Prudy Feeler) 0.5 MG tablet Take 0.5 mg by mouth at bedtime as needed for anxiety.    [provider]  Azilsartan-Chlorthalidone (EDARBYCLOR PO) Take by mouth.    [provider]  diclofenac (CATAFLAM) 50 MG tablet TAKE 1 TABLET BY MOUTH DAILY AS NEEDED WITH HEADACHE ONSET ; WITH FOOD 09/13/15   Anson Fret, MD  Empagliflozin (JARDIANCE PO) Take by mouth.    [provider]  fluticasone (FLONASE) 50 MCG/ACT nasal spray Place 2 sprays into both nostrils as needed. 12/23/14   [provider]  GLIPIZIDE PO Take by mouth.    [provider]  hydrochlorothiazide (MICROZIDE) 12.5 MG capsule Take 12.5 mg by mouth daily.    [provider]  HYDROcodone-acetaminophen (NORCO/VICODIN) 5-325 MG per tablet Take 1 tablet by mouth as needed. 12/17/14   [provider]  ibuprofen (ADVIL,MOTRIN) 800 MG tablet Take 800 mg by mouth every 8 (eight) hours as needed.    [provider]  insulin aspart protamine- aspart (NOVOLOG MIX 70/30) (70-30) 100 UNIT/ML injection Inject 20-35 Units into the skin 2 (two) times daily with a meal.     [provider]  insulin detemir (LEVEMIR) 100  UNIT/ML injection Inject 80 Units into the skin at bedtime.     [provider]  losartan (COZAAR) 100 MG tablet Take 100 mg by mouth daily.    [provider]  NOVOLOG FLEXPEN 100 UNIT/ML FlexPen  12/30/14   [provider]  ondansetron (ZOFRAN) 4 MG tablet Take 4 mg by mouth every 8 (eight) hours as needed for nausea or vomiting.    [provider]  ondansetron (ZOFRAN-ODT) 4 MG disintegrating tablet ALLOW 1 TABLET TO DISSOLVE UNDER THE TONGUE EVERY 8 HOURS AS NEEDED FOR NAUSEA OR VOMITING 01/31/16    Anson Fret, MD  simvastatin (ZOCOR) 20 MG tablet Take 20 mg by mouth daily. 12/23/14   [provider]  Testosterone 25 MG/2.5GM (1%) GEL Place 25 mg onto the skin daily. 12/08/14   [provider]  tiZANidine (ZANAFLEX) 4 MG tablet Take 4 mg by mouth every 6 (six) hours as needed for muscle spasms.    [provider]  Topiramate ER 100 MG CP24 Take 100 mg by mouth at bedtime. 03/01/15   Anson Fret, MD  verapamil (VERELAN PM) 240 MG 24 hr capsule TAKE 1 CAPSULE BY MOUTH AT BEDTIME 08/15/15   Anson Fret, MD    Family History Family History  Problem Relation Age of Onset  . Diabetes Paternal Grandfather   . Cancer Paternal Grandmother   . Lung cancer Maternal Grandmother     Social History Social History   Tobacco Use  . Smoking status: Former Smoker    Last attempt to quit: 10/23/1999    Years since quitting: 18.4  . Smokeless tobacco: Never Used  Substance Use Topics  . Alcohol use: No    Alcohol/week: 0.0 oz  . Drug use: No     Allergies   Reglan [metoclopramide] and Codeine   Review of Systems Review of Systems  All other systems reviewed and are negative.    Physical Exam Updated Vital Signs BP 136/71   Pulse 81   Temp 98.6 F (37 C) (Oral)   Resp 16   SpO2 95%   Physical Exam  Constitutional: He is oriented to person, place, and time. He appears well-developed and well-nourished.  HENT:  Head: Normocephalic and atraumatic.  Eyes: Pupils are equal, round, and reactive to light. Conjunctivae are normal. Right eye exhibits no discharge. Left eye exhibits no discharge. No scleral icterus.  Neck: Normal range of motion. No JVD present. No tracheal deviation present.  Cardiovascular: Normal rate, regular rhythm, normal heart sounds and intact distal pulses. Exam reveals no gallop and no friction rub.  No murmur heard. Pulmonary/Chest: Effort normal and breath sounds normal. No stridor. No respiratory distress. He has no  wheezes. He has no rales. He exhibits no tenderness.  Neurological: He is alert and oriented to person, place, and time. No cranial nerve deficit or sensory deficit. He exhibits normal muscle tone. Coordination normal. GCS eye subscore is 4. GCS verbal subscore is 5. GCS motor subscore is 6.  Psychiatric: He has a normal mood and affect. His behavior is normal. Judgment and thought content normal.  Nursing note and vitals reviewed.    ED Treatments / Results  Labs (all labs ordered are listed, but only abnormal results are displayed) Labs Reviewed  CBC - Abnormal; Notable for the following components:      Result Value   Hemoglobin 17.6 (*)    All other components within normal limits  COMPREHENSIVE METABOLIC PANEL - Abnormal; Notable for  the following components:   Chloride 98 (*)    Glucose, Bld 208 (*)    AST 42 (*)    Total Bilirubin 1.3 (*)    All other components within normal limits  PROTIME-INR  APTT  DIFFERENTIAL  I-STAT TROPONIN, ED    EKG EKG Interpretation  Date/Time:  Thursday April 03 2018 16:11:11 EDT Ventricular Rate:  96 PR Interval:  162 QRS Duration: 88 QT Interval:  352 QTC Calculation: 444 R Axis:   64 Text Interpretation:  Normal sinus rhythm T wave abnormality, consider inferior ischemia Abnormal ECG No old tracing to compare Confirmed by Margarita Grizzle [phone]) on 04/03/2018 9:56:04 PM   Radiology Dg Chest 2 View  Result Date: 04/03/2018 CLINICAL DATA:  Chest pain and shortness-of-breath with left arm numbness beginning 4 days ago. EXAM: CHEST - 2 VIEW COMPARISON:  07/01/2017 FINDINGS: Lungs are hypoinflated without consolidation or effusion. Cardiomediastinal silhouette and remainder of the exam is unchanged. IMPRESSION: Hypoinflation without acute cardiopulmonary disease. Electronically Signed   By: Elberta Fortis M.D.   On: 04/03/2018 16:42   Ct Head Wo Contrast  Result Date: 04/03/2018 CLINICAL DATA:  Migraine for 4 days. Left arm and facial  numbness. Blurring in left eye. EXAM: CT HEAD WITHOUT CONTRAST TECHNIQUE: Contiguous axial images were obtained from the base of the skull through the vertex without intravenous contrast. COMPARISON:  None. FINDINGS: Brain: No subdural, epidural, or subarachnoid hemorrhage. Cerebellum, brainstem, and basal cisterns are normal. Ventricles and sulci are normal. No acute cortical ischemia or infarct. No mass effect or midline shift. Vascular: No hyperdense vessel or unexpected calcification. Skull: Normal. Negative for fracture or focal lesion. Sinuses/Orbits: No acute finding. Other: None. IMPRESSION: No acute intracranial abnormalities to explain the patient's symptoms. Electronically Signed   By: Gerome Sam III M.D   On: 04/03/2018 18:33    Procedures Procedures (including critical care time)  Medications Ordered in ED Medications  ketorolac (TORADOL) 30 MG/ML injection 30 mg (30 mg Intramuscular Given 04/03/18 2033)  promethazine (PHENERGAN) tablet 25 mg (25 mg Oral Given 04/03/18 2033)     Initial Impression / Assessment and Plan / ED Course  I have reviewed the triage vital signs and the nursing notes.  Pertinent labs & imaging results that were available during my care of the patient were reviewed by me and considered in my medical decision making (see chart for details).     Labs: I-STAT troponin, PT/INR, APTT, CBC differential, CMP  Imaging: CT head without- DG chest 2 view , ED EKG  Consults:  Therapeutics: Toradol, promethazine  Discharge Meds:   Assessment/Plan: 37 year-old male presents today with complaints of migraine.  Patient did have an episode of numbness in his left arm and face yesterday.  This has not returned was very brief.  Patient does have a history of complicated migraines in the past.  Patient has resolution of symptoms with headache cocktail here.  I have low suspicion for acute stroke, encouraged patient to follow-up with outpatient neurology for  reevaluation.  Patient also had an episode of chest pressure yesterday, he has no pain here, no signs or symptoms consistent with ACS, reassuring EKG negative troponin.  Patient encouraged follow-up with outpatient cardiology, strict return precautions given, he verbalized understanding and agreement to today's plan had no further questions or concerns.      Final Clinical Impressions(s) / ED Diagnoses   Final diagnoses:  Migraine without status migrainosus, not intractable, unspecified migraine type  Chest pain, unspecified type  ED Discharge Orders    None       Rosalio LoudHedges, Karcyn Menn, PA-C 04/03/18 2208    Margarita Grizzleay, Danielle, MD 04/14/18 [phone]

## 2018-04-03 NOTE — ED Provider Notes (Signed)
Patient placed in Quick Look pathway, seen and evaluated   Chief Complaint: headache, vision changes, numbness, chest pain, shortness breath  HPI:   Patient with history of recurrent headaches presents with 4 day history of generalized headache. Pain is throbbing in nature, primarily in the frontal region and radiates "all over ". States it feels somewhat different from his usual headaches, primarily due to an episode of left-sided facial numbness and left upper extremity numbness which lasted for proximally 6-7 minutes before resolving. This occurred yesterday. He also noted blurry vision of the left eye during his episode of numbness. No aggravating or alleviating factors noted.  He notes substernal chest pressure and shortness of breath which is intermittent since yesterday. It is not exertional or pleuritic. He has tried Zanaflex at home without relief of his symptoms. He went to urgent carebut did not feel comfortable with treatment of his headache rare and observation for further symptoms overnight and preferred to present to the ED for further evaluation.denies any numbness, vision changes, chest pain, or shortness of breath at this time.  ROS: positive for headaches, vision changes, numbness, chest pain, shortness of breath negative for fevers, chills, syncope, weakness  Physical Exam:   Gen: No distress  Neuro: Awake and Alert  Skin: Warm    Focused Exam: heart rate and rhythm regular, no murmurs rubs or gallops. Lungs clear to auscultation bilaterally. 2+ radial and DP/PT pulses bilaterally. No lower externally edema. No tenderness palpation of the chest wall.  He exhibits fluent speech with no evidence of dysarthria aphasia, no facial droop, sensation intact to soft touch of extremities and face. Cranial nerves II through XII tested and intact. No pronator drift, no nystagmus, Romberg sign absent.ambulate with a steady gait and balance, able to heel walk and toe walk without  difficulty.   Initiation of care has begun. The patient has been counseled on the process, plan, and necessity for staying for the completion/evaluation, and the remainder of the medical screening examination    Bennye AlmFawze, Johni Narine A, PA-C 04/03/18 1631    Arby BarrettePfeiffer, Marcy, MD 04/09/18 1203

## 2018-04-04 DIAGNOSIS — E113211 Type 2 diabetes mellitus with mild nonproliferative diabetic retinopathy with macular edema, right eye: Secondary | ICD-10-CM | POA: Diagnosis not present

## 2018-04-23 DIAGNOSIS — G43019 Migraine without aura, intractable, without status migrainosus: Secondary | ICD-10-CM | POA: Diagnosis not present

## 2018-06-27 DIAGNOSIS — Z6841 Body Mass Index (BMI) 40.0 and over, adult: Secondary | ICD-10-CM | POA: Diagnosis not present

## 2018-06-27 DIAGNOSIS — R21 Rash and other nonspecific skin eruption: Secondary | ICD-10-CM | POA: Diagnosis not present

## 2018-07-25 DIAGNOSIS — I10 Essential (primary) hypertension: Secondary | ICD-10-CM | POA: Diagnosis not present

## 2018-07-25 DIAGNOSIS — Z6841 Body Mass Index (BMI) 40.0 and over, adult: Secondary | ICD-10-CM | POA: Diagnosis not present

## 2018-09-25 DIAGNOSIS — Z6841 Body Mass Index (BMI) 40.0 and over, adult: Secondary | ICD-10-CM | POA: Diagnosis not present

## 2018-09-25 DIAGNOSIS — I10 Essential (primary) hypertension: Secondary | ICD-10-CM | POA: Diagnosis not present

## 2018-12-03 DIAGNOSIS — R1013 Epigastric pain: Secondary | ICD-10-CM | POA: Diagnosis not present

## 2018-12-03 DIAGNOSIS — K589 Irritable bowel syndrome without diarrhea: Secondary | ICD-10-CM | POA: Diagnosis not present

## 2018-12-03 DIAGNOSIS — Z6841 Body Mass Index (BMI) 40.0 and over, adult: Secondary | ICD-10-CM | POA: Diagnosis not present

## 2018-12-03 DIAGNOSIS — R21 Rash and other nonspecific skin eruption: Secondary | ICD-10-CM | POA: Diagnosis not present

## 2018-12-26 DIAGNOSIS — I10 Essential (primary) hypertension: Secondary | ICD-10-CM | POA: Diagnosis not present

## 2018-12-26 DIAGNOSIS — Z6841 Body Mass Index (BMI) 40.0 and over, adult: Secondary | ICD-10-CM | POA: Diagnosis not present

## 2019-01-09 DIAGNOSIS — E782 Mixed hyperlipidemia: Secondary | ICD-10-CM | POA: Diagnosis not present

## 2019-01-09 DIAGNOSIS — E119 Type 2 diabetes mellitus without complications: Secondary | ICD-10-CM | POA: Diagnosis not present

## 2019-01-09 DIAGNOSIS — I10 Essential (primary) hypertension: Secondary | ICD-10-CM | POA: Diagnosis not present

## 2019-01-16 DIAGNOSIS — I10 Essential (primary) hypertension: Secondary | ICD-10-CM | POA: Diagnosis not present

## 2019-01-16 DIAGNOSIS — E119 Type 2 diabetes mellitus without complications: Secondary | ICD-10-CM | POA: Diagnosis not present

## 2019-01-16 DIAGNOSIS — K219 Gastro-esophageal reflux disease without esophagitis: Secondary | ICD-10-CM | POA: Diagnosis not present

## 2020-03-26 MED FILL — VERAPAMIL SR 120 MG CAPSULE: 120 | 30 days supply | Qty: 30 | Fill #0

## 2020-03-26 MED FILL — ONE TOUCH VERIO TEST STRIP: 16 days supply | Qty: 50 | Fill #0

## 2020-03-26 MED FILL — PHENTERMINE 37.5 MG TABLET: 37.5 | 30 days supply | Qty: 30 | Fill #0

## 2020-03-26 MED FILL — GLIPIZIDE ER 5 MG TB24: 5 | 30 days supply | Qty: 30 | Fill #0

## 2020-03-26 MED FILL — EDARBYCLOR 40-12.5 MG TAB: 40-12.5 | 30 days supply | Qty: 30 | Fill #0

## 2020-03-26 MED FILL — ALPRAZolam 0.5 MG TABS: 0.5 | 30 days supply | Qty: 60 | Fill #0

## 2020-03-26 MED FILL — NALTREXONE 50 MG TABLET: 50 | 30 days supply | Qty: 15 | Fill #0

## 2020-03-26 MED FILL — OMEPRAZOLE 40 MG CPDR: 40 | 30 days supply | Qty: 30 | Fill #0

## 2020-04-05 MED FILL — NYSTATIN 100000 UNIT/GM POW: 100000 | 7 days supply | Qty: 15 | Fill #0

## 2020-04-16 MED FILL — RYBELSUS 7 MG TABS: 7 | 30 days supply | Qty: 30 | Fill #0

## 2020-04-27 MED FILL — GLIPIZIDE ER 5 MG TB24: 5 | 30 days supply | Qty: 30 | Fill #0

## 2020-04-27 MED FILL — ALPRAZolam 0.5 MG TABS: 0.5 | 30 days supply | Qty: 60 | Fill #1

## 2020-04-27 MED FILL — EDARBYCLOR 40-12.5 MG TAB: 40-12.5 | 30 days supply | Qty: 30 | Fill #1

## 2020-04-27 MED FILL — VERAPAMIL SR 120 MG CAPSULE: 120 | 30 days supply | Qty: 30 | Fill #1

## 2020-04-28 MED FILL — OMEPRAZOLE 40 MG CPDR: 40 | 30 days supply | Qty: 30 | Fill #1

## 2020-04-28 MED FILL — NALTREXONE 50 MG TABLET: 50 | 30 days supply | Qty: 15 | Fill #1

## 2020-05-07 MED FILL — JARDIANCE 25 MG TABLET: 25 | 30 days supply | Qty: 30 | Fill #0

## 2020-05-24 MED FILL — JARDIANCE 25 MG TABLET: 25 | 30 days supply | Qty: 30 | Fill #0

## 2020-06-28 MED FILL — JARDIANCE 25 MG TABLET: 25 | 30 days supply | Qty: 30 | Fill #1

## 2020-06-28 MED FILL — GLIPIZIDE ER 5 MG TB24: 5 | 30 days supply | Qty: 30 | Fill #0

## 2020-06-29 MED FILL — OMEPRAZOLE 40 MG CPDR: 40 | 30 days supply | Qty: 30 | Fill #0

## 2020-06-29 MED FILL — NALTREXONE 50 MG TABLET: 50 | 30 days supply | Qty: 15 | Fill #0

## 2020-06-29 MED FILL — VERAPAMIL SR 120 MG CAPSULE: 120 | 30 days supply | Qty: 30 | Fill #0

## 2020-06-29 MED FILL — EDARBYCLOR 40-12.5 MG TAB: 40-12.5 | 30 days supply | Qty: 30 | Fill #0

## 2020-06-30 MED FILL — ALPRAZolam 0.5 MG TABS: 0.5 | 30 days supply | Qty: 60 | Fill #0

## 2020-07-07 MED FILL — GLIPIZIDE ER 5 MG TB24: 5 | 30 days supply | Qty: 30 | Fill #0

## 2020-07-15 ENCOUNTER — Other Ambulatory Visit: Payer: Self-pay

## 2020-07-15 ENCOUNTER — Ambulatory Visit: Payer: 59 | Attending: Internal Medicine

## 2020-07-15 DIAGNOSIS — Z23 Encounter for immunization: Secondary | ICD-10-CM

## 2020-07-15 NOTE — Progress Notes (Signed)
   Covid-19 Vaccination Clinic  Name:  Christopher Solomon    MRN: [id number] DOB: 10/05/1981  07/15/2020  [name] was observed post Covid-19 immunization for 15 minutes without incident. He was provided with Vaccine Information Sheet and instruction to access the V-Safe system. Vaccinated by [facility].  [name] was instructed to call 911 with any severe reactions post vaccine: Marland Kitchen Difficulty breathing  . Swelling of face and throat  . A fast heartbeat  . A bad rash all over body  . Dizziness and weakness   Immunizations Administered    Name Date Dose VIS Date Route   Pfizer COVID-19 Vaccine 07/15/2020 10:47 AM 0.3 mL 12/16/2018 Intramuscular   Manufacturer: [employer], [employer]   Lot: [id number]   NDC: [id number]

## 2020-08-02 ENCOUNTER — Other Ambulatory Visit: Payer: Self-pay | Admitting: Internal Medicine

## 2020-08-02 DIAGNOSIS — M545 Low back pain, unspecified: Secondary | ICD-10-CM

## 2020-08-10 ENCOUNTER — Ambulatory Visit
Admission: RE | Admit: 2020-08-10 | Discharge: 2020-08-10 | Disposition: A | Discharge status: Home / Self Care | Payer: 59 | Source: Ambulatory Visit | Attending: Internal Medicine | Admitting: Internal Medicine

## 2020-08-10 ENCOUNTER — Other Ambulatory Visit: Payer: Self-pay

## 2020-08-10 DIAGNOSIS — M545 Low back pain, unspecified: Secondary | ICD-10-CM

## 2020-08-21 ENCOUNTER — Inpatient Hospital Stay: Admission: RE | Admit: 2020-08-21 | Payer: 59 | Source: Ambulatory Visit

## 2020-08-22 ENCOUNTER — Other Ambulatory Visit: Payer: Self-pay | Admitting: Physician Assistant

## 2020-08-22 DIAGNOSIS — M545 Low back pain, unspecified: Secondary | ICD-10-CM

## 2020-08-31 ENCOUNTER — Ambulatory Visit
Admission: RE | Admit: 2020-08-31 | Discharge: 2020-08-31 | Disposition: A | Discharge status: Home / Self Care | Payer: 59 | Source: Ambulatory Visit | Attending: Physician Assistant | Admitting: Physician Assistant

## 2020-08-31 DIAGNOSIS — M545 Low back pain, unspecified: Secondary | ICD-10-CM

## 2020-08-31 MED ORDER — METHYLPREDNISOLONE ACETATE 40 MG/ML INJ SUSP (RADIOLOG
120.0000 mg | Freq: Once | INTRAMUSCULAR | Status: AC
  Administered 2020-08-31: 120 mg via EPIDURAL

## 2020-08-31 MED ORDER — IOPAMIDOL (ISOVUE-M 200) INJECTION 41%
1.0000 mL | Freq: Once | INTRAMUSCULAR | Status: AC
  Administered 2020-08-31: 1 mL via EPIDURAL

## 2020-08-31 NOTE — Discharge Instructions (Signed)

## 2021-02-15 ENCOUNTER — Emergency Department (HOSPITAL_COMMUNITY): Payer: 59

## 2021-02-15 ENCOUNTER — Emergency Department (HOSPITAL_COMMUNITY)
Admission: EM | Admit: 2021-02-15 | Discharge: 2021-02-15 | Disposition: A | Discharge status: Home / Self Care | Payer: 59 | Attending: Emergency Medicine | Admitting: Emergency Medicine

## 2021-02-15 ENCOUNTER — Encounter (HOSPITAL_COMMUNITY): Payer: Self-pay | Admitting: *Deleted

## 2021-02-15 ENCOUNTER — Other Ambulatory Visit: Payer: Self-pay

## 2021-02-15 DIAGNOSIS — I10 Essential (primary) hypertension: Secondary | ICD-10-CM | POA: Diagnosis not present

## 2021-02-15 DIAGNOSIS — R079 Chest pain, unspecified: Secondary | ICD-10-CM | POA: Diagnosis not present

## 2021-02-15 DIAGNOSIS — E119 Type 2 diabetes mellitus without complications: Secondary | ICD-10-CM | POA: Insufficient documentation

## 2021-02-15 DIAGNOSIS — Z87891 Personal history of nicotine dependence: Secondary | ICD-10-CM | POA: Insufficient documentation

## 2021-02-15 DIAGNOSIS — Z79899 Other long term (current) drug therapy: Secondary | ICD-10-CM | POA: Insufficient documentation

## 2021-02-15 DIAGNOSIS — Z7984 Long term (current) use of oral hypoglycemic drugs: Secondary | ICD-10-CM | POA: Diagnosis not present

## 2021-02-15 DIAGNOSIS — R9431 Abnormal electrocardiogram [ECG] [EKG]: Secondary | ICD-10-CM

## 2021-02-15 DIAGNOSIS — Z794 Long term (current) use of insulin: Secondary | ICD-10-CM | POA: Insufficient documentation

## 2021-02-15 LAB — CBC
HCT: 40.3 % (ref 39.0–52.0)
Hemoglobin: 13.6 g/dL (ref 13.0–17.0)
MCH: 31.9 pg (ref 26.0–34.0)
MCHC: 33.7 g/dL (ref 30.0–36.0)
MCV: 94.4 fL (ref 80.0–100.0)
Platelets: 156 10*3/uL (ref 150–400)
RBC: 4.27 MIL/uL (ref 4.22–5.81)
RDW: 12.4 % (ref 11.5–15.5)
WBC: 9 10*3/uL (ref 4.0–10.5)
nRBC: 0 % (ref 0.0–0.2)

## 2021-02-15 LAB — BASIC METABOLIC PANEL
Anion gap: 12 (ref 5–15)
BUN: 21 mg/dL — ABNORMAL HIGH (ref 6–20)
CO2: 24 mmol/L (ref 22–32)
Calcium: 9.1 mg/dL (ref 8.9–10.3)
Chloride: 99 mmol/L (ref 98–111)
Creatinine, Ser: 0.87 mg/dL (ref 0.61–1.24)
GFR, Estimated: >60 mL/min (ref >60)
Glucose, Bld: 201 mg/dL — ABNORMAL HIGH (ref 70–99)
Potassium: 4.4 mmol/L (ref 3.5–5.1)
Sodium: 135 mmol/L (ref 135–145)

## 2021-02-15 LAB — TROPONIN I (HIGH SENSITIVITY)
Troponin I (High Sensitivity): 3 ng/L (ref <18)
Troponin I (High Sensitivity): 3 ng/L (ref <18)

## 2021-02-15 MED ORDER — NITROGLYCERIN 2 % TD OINT: 0.5000 [in_us] | TOPICAL_OINTMENT | Freq: Once | TRANSDERMAL | Status: DC

## 2021-02-15 MED ORDER — ASPIRIN 81 MG PO CHEW
324.0000 mg | CHEWABLE_TABLET | Freq: Once | ORAL | Status: AC
  Administered 2021-02-15: 324 mg via ORAL
  Filled 2021-02-15: qty 4

## 2021-02-15 NOTE — ED Notes (Signed)
The pt has no chest pain

## 2021-02-15 NOTE — ED Triage Notes (Signed)
The pt was sent here from a doctors office in [place] c/o chest pain for 3 days   Some sob with exertion  No chest pain at present

## 2021-02-15 NOTE — Discharge Instructions (Signed)
Your testing is normal - no signs of heart attack Take a baby aspirin daily until you see the cardiologist ER for worsening symptoms -  Call the cardiologist in the morning to make a follow up - you likely need a stress test.

## 2021-02-15 NOTE — ED Provider Notes (Signed)
[facility] EMERGENCY DEPARTMENT Provider Note   CSN: [id number] Arrival date & time: 02/15/21  1615     History Chief Complaint  Patient presents with  . Chest Pain    Christopher Solomon is a 40 y.o. male.  HPI   This patient is a 40 year old male history of diabetes hypertension and high cholesterol treated for all of the above presenting to the hospital with a complaint of left-sided chest pain intermittent for the last 3 days seems to be in the left upper chest with radiation into the left arm.  He is not having any vomiting or diarrhea coughing or shortness of breath at rest but does have some shortness of breath with exertion.  He does not have any chest pain with exertion, he is not diaphoretic and has no swelling of the legs.  He reports the pain lasted for somewhere between 10 and 30 minutes is coming and going and is not related to position exertion or eating.  He does have increased pain in the left upper chest when he takes of breath.  He does report to me that last week he was sick all week long, stayed home from work with vomiting and generally feeling fatigued but that seems to have totally resolved and he is now eating and drinking again without difficulty.  He has never had any cardiac disease though he does report that his primary doctor diagnosed him with angina and gave him something to help, he does not know the name of the medication and does not take it regularly.  He was at the doctor's office today for these complaints when he left work, he was sent here because of the thought that this could be acute coronary syndrome.  He came by private vehicle.  Past Medical History:  Diagnosis Date  . Diabetes mellitus without complication (HCC)   . Headaches, cluster    tension  . High cholesterol   . Hypertension     Patient Active Problem List   Diagnosis Date Noted  . Morbid obesity with BMI of 40.0-44.9, adult (HCC) 03/08/2015  . Obesity  hypoventilation syndrome (HCC) 03/08/2015  . Visual aura 01/08/2015  . Migraine with aura and with status migrainosus 12/22/2014  . Morbid obesity (HCC) 12/22/2014  . OSA (obstructive sleep apnea) 12/22/2014  . Diabetes type 2, controlled (HCC) 12/12/2014  . HTN (hypertension) 12/12/2014  . Hyperlipidemia 12/12/2014  . BMI 45.0-49.9, adult (HCC) 12/12/2014    Past Surgical History:  Procedure Laterality Date  . no surgical history         Family History  Problem Relation Age of Onset  . Diabetes Paternal Grandfather   . Cancer Paternal Grandmother   . Lung cancer Maternal Grandmother     Social History   Tobacco Use  . Smoking status: Former Smoker    Quit date: 10/23/1999    Years since quitting: 21.3  . Smokeless tobacco: Never Used  Substance Use Topics  . Alcohol use: No    Alcohol/week: 0.0 standard drinks  . Drug use: No    Home Medications Prior to Admission medications   Medication Sig Start Date End Date Taking? Authorizing Provider  ALPRAZolam Prudy Feeler) 0.5 MG tablet Take 0.5 mg by mouth at bedtime as needed for anxiety.    [provider]  Azilsartan-Chlorthalidone (EDARBYCLOR PO) Take by mouth.    [provider]  diclofenac (CATAFLAM) 50 MG tablet TAKE 1 TABLET BY MOUTH DAILY AS NEEDED WITH HEADACHE ONSET ;  WITH FOOD 09/13/15   Anson Fret, MD  Empagliflozin (JARDIANCE PO) Take by mouth.    [provider]  fluticasone (FLONASE) 50 MCG/ACT nasal spray Place 2 sprays into both nostrils as needed. 12/23/14   [provider]  GLIPIZIDE PO Take by mouth.    [provider]  hydrochlorothiazide (MICROZIDE) 12.5 MG capsule Take 12.5 mg by mouth daily.    [provider]  HYDROcodone-acetaminophen (NORCO/VICODIN) 5-325 MG per tablet Take 1 tablet by mouth as needed. 12/17/14   [provider]  ibuprofen (ADVIL,MOTRIN) 800 MG tablet Take 800 mg by mouth every 8 (eight) hours as needed.    [provider]  insulin aspart protamine- aspart (NOVOLOG MIX 70/30) (70-30) 100 UNIT/ML injection Inject 20-35 Units into the skin 2 (two) times daily with a meal.     [provider]  insulin detemir (LEVEMIR) 100 UNIT/ML injection Inject 80 Units into the skin at bedtime.     [provider]  losartan (COZAAR) 100 MG tablet Take 100 mg by mouth daily.    [provider]  NOVOLOG FLEXPEN 100 UNIT/ML FlexPen  12/30/14   [provider]  ondansetron (ZOFRAN) 4 MG tablet Take 4 mg by mouth every 8 (eight) hours as needed for nausea or vomiting.    [provider]  ondansetron (ZOFRAN-ODT) 4 MG disintegrating tablet ALLOW 1 TABLET TO DISSOLVE UNDER THE TONGUE EVERY 8 HOURS AS NEEDED FOR NAUSEA OR VOMITING 01/31/16   Anson Fret, MD  simvastatin (ZOCOR) 20 MG tablet Take 20 mg by mouth daily. 12/23/14   [provider]  Testosterone 25 MG/2.5GM (1%) GEL Place 25 mg onto the skin daily. 12/08/14   [provider]  tiZANidine (ZANAFLEX) 4 MG tablet Take 4 mg by mouth every 6 (six) hours as needed for muscle spasms.    [provider]  Topiramate ER 100 MG CP24 Take 100 mg by mouth at bedtime. 03/01/15   Anson Fret, MD  verapamil (VERELAN PM) 240 MG 24 hr capsule TAKE 1 CAPSULE BY MOUTH AT BEDTIME 08/15/15   Anson Fret, MD    Allergies    Codeine and Metoclopramide  Review of Systems   Review of Systems  All other systems reviewed and are negative.   Physical Exam Updated Vital Signs BP 130/72   Pulse 81   Temp 98.5 F (36.9 C) (Oral)   Resp 12   Ht 1.753 m (5\' 9" )   Wt (!) 141.1 kg   SpO2 98%   BMI 45.93 kg/m   Physical Exam Vitals and nursing note reviewed.  Constitutional:      General: He is not in acute distress.    Appearance: He is well-developed.  HENT:     Head: Normocephalic and atraumatic.     Mouth/Throat:     Pharynx: No oropharyngeal exudate.  Eyes:     General: No scleral icterus.        Right eye: No discharge.        Left eye: No discharge.     Conjunctiva/sclera: Conjunctivae normal.     Pupils: Pupils are equal, round, and reactive to light.  Neck:     Thyroid: No thyromegaly.     Vascular: No JVD.  Cardiovascular:     Rate and Rhythm: Normal rate and regular rhythm.     Heart sounds: Normal heart sounds. No murmur heard. No friction rub. No gallop.   Pulmonary:     Effort:  Pulmonary effort is normal. No respiratory distress.     Breath sounds: Normal breath sounds. No wheezing or rales.  Chest:     Chest wall: No tenderness.  Abdominal:     General: Bowel sounds are normal. There is no distension.     Palpations: Abdomen is soft. There is no mass.     Tenderness: There is no abdominal tenderness.  Musculoskeletal:        General: No tenderness. Normal range of motion.     Cervical back: Normal range of motion and neck supple.  Lymphadenopathy:     Cervical: No cervical adenopathy.  Skin:    General: Skin is warm and dry.     Findings: No erythema or rash.  Neurological:     Mental Status: He is alert.     Coordination: Coordination normal.  Psychiatric:        Behavior: Behavior normal.     ED Results / Procedures / Treatments   Labs (all labs ordered are listed, but only abnormal results are displayed) Labs Reviewed  BASIC METABOLIC PANEL - Abnormal; Notable for the following components:      Result Value   Glucose, Bld 201 (*)    BUN 21 (*)    All other components within normal limits  CBC  TROPONIN I (HIGH SENSITIVITY)  TROPONIN I (HIGH SENSITIVITY)    EKG EKG Interpretation  Date/Time:  Wednesday February 15 2021 16:20:15 EDT Ventricular Rate:  89 PR Interval:  182 QRS Duration: 88 QT Interval:  346 QTC Calculation: 420 R Axis:   54 Text Interpretation: Normal sinus rhythm Cannot rule out Anterior infarct , age undetermined Abnormal ECG since 2019, June 13, now has some diffuse ST abnormalities Confirmed by Eber Hong ([id number])  on 02/15/2021 4:34:23 PM   Radiology DG Chest Port 1 View  Result Date: 02/15/2021 CLINICAL DATA:  Abnormal electrocardiogram EXAM: PORTABLE CHEST 1 VIEW COMPARISON:  April 03, 2018 FINDINGS: Lungs are clear. Heart size and pulmonary vascularity within normal limits. No adenopathy. No pneumothorax. No bone lesions. IMPRESSION: Lungs clear.  Cardiac silhouette within normal limits. Electronically Signed   By: Bretta Bang III M.D.   On: 02/15/2021 17:27    Procedures Procedures   Medications Ordered in ED Medications  nitroGLYCERIN (NITROGLYN) 2 % ointment 0.5 inch (has no administration in time range)  aspirin chewable tablet 324 mg (324 mg Oral Given 02/15/21 1700)    ED Course  I have reviewed the triage vital signs and the nursing notes.  Pertinent labs & imaging results that were available during my care of the patient were reviewed by me and considered in my medical decision making (see chart for details).    MDM Rules/Calculators/A&P                          No distress, vital signs remarkable for hypertension but no tachycardia, no edema, low risk for pulmonary embolism, given recent illness would consider that this could be a postviral type costochondritis though he is not tender to palpation but he is deep breathing tender.  EKG does have mild ST abnormalities including what appears to be some PR depression with ST elevation in some leads but not all leads, will discuss with cardiology.  Troponin pending, patient otherwise appears to be in no distress.  He is having active pain, aspirin and nitroglycerin given.  Discussed with Cardiology at 4:45 - they agree EKG is not a STEMI  Discussed  with Dr. Diona BrownerMcDowell of the cardiology service who agrees that this patient can be seen in the outpatient setting for stress test, the patient also agrees that he is okay with this plan, he is chest pain-free at the time of discharge with 2 negative troponins, the patient is agreeable to  return should symptoms worsen, blood pressure 130/72 at discharge  81mg  ASA for home  Final Clinical Impression(s) / ED Diagnoses Final diagnoses:  Left-sided chest pain      Eber HongMiller, Scarlettrose Costilow, MD 02/15/21 2032

## 2021-02-15 NOTE — ED Notes (Signed)
No chest pain 2nd tro drawn

## 2021-02-24 ENCOUNTER — Ambulatory Visit
Admission: RE | Admit: 2021-02-24 | Discharge: 2021-02-24 | Disposition: A | Discharge status: Home / Self Care | Payer: 59 | Source: Ambulatory Visit | Attending: Adult Health Nurse Practitioner | Admitting: Adult Health Nurse Practitioner

## 2021-02-24 ENCOUNTER — Other Ambulatory Visit: Payer: Self-pay | Admitting: Adult Health Nurse Practitioner

## 2021-02-24 ENCOUNTER — Other Ambulatory Visit: Payer: Self-pay

## 2021-02-24 ENCOUNTER — Other Ambulatory Visit: Payer: Self-pay | Admitting: Family Medicine

## 2021-02-24 DIAGNOSIS — R0989 Other specified symptoms and signs involving the circulatory and respiratory systems: Secondary | ICD-10-CM

## 2021-02-24 MED ORDER — IOPAMIDOL (ISOVUE-370) INJECTION 76%
75.0000 mL | Freq: Once | INTRAVENOUS | Status: AC | PRN
  Administered 2021-02-24: 75 mL via INTRAVENOUS

## 2021-03-07 ENCOUNTER — Telehealth: Payer: Self-pay

## 2021-03-07 NOTE — Telephone Encounter (Signed)
NOTES ON FILE FROM [facility] [phone], SENT REFERRAL TO SCHEDULING

## 2021-03-10 ENCOUNTER — Ambulatory Visit: Payer: 59 | Admitting: Internal Medicine

## 2021-03-14 ENCOUNTER — Other Ambulatory Visit: Payer: Self-pay | Admitting: *Deleted

## 2021-03-14 ENCOUNTER — Telehealth: Payer: Self-pay | Admitting: *Deleted

## 2021-03-14 DIAGNOSIS — R079 Chest pain, unspecified: Secondary | ICD-10-CM

## 2021-03-14 NOTE — Telephone Encounter (Signed)
Order placed for lexi scan stress test. Referral from [facility].

## 2021-04-04 ENCOUNTER — Encounter (HOSPITAL_COMMUNITY)
Admission: RE | Admit: 2021-04-04 | Discharge: 2021-04-04 | Disposition: A | Discharge status: Home / Self Care | Payer: 59 | Source: Ambulatory Visit | Attending: Cardiology | Admitting: Cardiology

## 2021-04-04 ENCOUNTER — Ambulatory Visit (HOSPITAL_COMMUNITY)
Admission: RE | Admit: 2021-04-04 | Discharge: 2021-04-04 | Disposition: A | Discharge status: Home / Self Care | Payer: 59 | Source: Ambulatory Visit | Attending: Cardiology | Admitting: Cardiology

## 2021-04-04 DIAGNOSIS — R079 Chest pain, unspecified: Secondary | ICD-10-CM | POA: Diagnosis not present

## 2021-04-04 DIAGNOSIS — I309 Acute pericarditis, unspecified: Secondary | ICD-10-CM | POA: Diagnosis not present

## 2021-04-04 LAB — NM MYOCAR MULTI W/SPECT W/WALL MOTION / EF
LV dias vol: 89 mL (ref 62–150)
LV sys vol: 31 mL
Peak HR: 110 {beats}/min
RATE: 0.4
Rest HR: 90 {beats}/min
SDS: 0
SRS: 2
SSS: 2
TID: 1.29

## 2021-04-04 MED ORDER — SODIUM CHLORIDE FLUSH 0.9 % IV SOLN
INTRAVENOUS | Status: AC
  Administered 2021-04-04: 10 mL via INTRAVENOUS
  Filled 2021-04-04: qty 10

## 2021-04-04 MED ORDER — REGADENOSON 0.4 MG/5ML IV SOLN
INTRAVENOUS | Status: AC
  Administered 2021-04-04: 0.4 mg via INTRAVENOUS
  Filled 2021-04-04: qty 5

## 2021-04-04 MED ORDER — TECHNETIUM TC 99M TETROFOSMIN IV KIT
10.0000 | PACK | Freq: Once | INTRAVENOUS | Status: AC | PRN
  Administered 2021-04-04: 11 via INTRAVENOUS

## 2021-04-04 MED ORDER — TECHNETIUM TC 99M TETROFOSMIN IV KIT
30.0000 | PACK | Freq: Once | INTRAVENOUS | Status: AC | PRN
  Administered 2021-04-04: 31 via INTRAVENOUS

## 2021-04-10 ENCOUNTER — Other Ambulatory Visit: Payer: Self-pay | Admitting: Internal Medicine

## 2021-04-10 DIAGNOSIS — R0600 Dyspnea, unspecified: Secondary | ICD-10-CM

## 2021-04-14 ENCOUNTER — Other Ambulatory Visit: Payer: Self-pay | Admitting: Internal Medicine

## 2021-04-14 ENCOUNTER — Ambulatory Visit
Admission: RE | Admit: 2021-04-14 | Discharge: 2021-04-14 | Disposition: A | Discharge status: Home / Self Care | Payer: 59 | Source: Ambulatory Visit | Attending: Internal Medicine | Admitting: Internal Medicine

## 2021-04-14 DIAGNOSIS — R0600 Dyspnea, unspecified: Secondary | ICD-10-CM

## 2021-04-14 MED ORDER — IOPAMIDOL (ISOVUE-370) INJECTION 76%
75.0000 mL | Freq: Once | INTRAVENOUS | Status: AC | PRN
  Administered 2021-04-14: 75 mL via INTRAVENOUS

## 2021-05-04 ENCOUNTER — Ambulatory Visit: Payer: 59 | Admitting: Cardiology

## 2021-05-31 ENCOUNTER — Other Ambulatory Visit (HOSPITAL_COMMUNITY): Payer: Self-pay

## 2021-05-31 MED ORDER — MOUNJARO 2.5 MG/0.5ML ~~LOC~~ SOAJ
SUBCUTANEOUS | 0 refills | Status: DC
  Filled 2021-05-31: qty 2, 28d supply, fill #0

## 2021-05-31 MED ORDER — TRIAMCINOLONE ACETONIDE 0.1 % EX CREA
TOPICAL_CREAM | Freq: Two times a day (BID) | CUTANEOUS | 2 refills | Status: DC
  Filled 2021-05-31: qty 454, 30d supply, fill #0

## 2021-06-01 ENCOUNTER — Other Ambulatory Visit (HOSPITAL_COMMUNITY): Payer: Self-pay

## 2021-11-21 ENCOUNTER — Other Ambulatory Visit (HOSPITAL_COMMUNITY): Payer: Self-pay

## 2022-01-04 ENCOUNTER — Other Ambulatory Visit: Payer: Self-pay | Admitting: Adult Health Nurse Practitioner

## 2022-01-05 ENCOUNTER — Ambulatory Visit
Admission: RE | Admit: 2022-01-05 | Discharge: 2022-01-05 | Disposition: A | Discharge status: Home / Self Care | Payer: 59 | Source: Ambulatory Visit | Attending: Adult Health Nurse Practitioner | Admitting: Adult Health Nurse Practitioner

## 2022-01-05 ENCOUNTER — Other Ambulatory Visit: Payer: Self-pay

## 2022-01-05 DIAGNOSIS — R0609 Other forms of dyspnea: Secondary | ICD-10-CM

## 2022-02-09 ENCOUNTER — Encounter: Payer: Self-pay | Admitting: Cardiovascular Disease

## 2022-02-09 ENCOUNTER — Ambulatory Visit: Payer: 59 | Admitting: Cardiovascular Disease

## 2022-02-09 DIAGNOSIS — G4733 Obstructive sleep apnea (adult) (pediatric): Secondary | ICD-10-CM

## 2022-02-09 DIAGNOSIS — E782 Mixed hyperlipidemia: Secondary | ICD-10-CM | POA: Diagnosis not present

## 2022-02-09 DIAGNOSIS — Z6841 Body Mass Index (BMI) 40.0 and over, adult: Secondary | ICD-10-CM

## 2022-02-09 DIAGNOSIS — I10 Essential (primary) hypertension: Secondary | ICD-10-CM

## 2022-02-09 DIAGNOSIS — R0609 Other forms of dyspnea: Secondary | ICD-10-CM

## 2022-02-09 MED ORDER — METOPROLOL TARTRATE 100 MG PO TABS: 100.0000 mg | ORAL_TABLET | Freq: Once | ORAL | 0 refills | Status: DC

## 2022-02-09 NOTE — Assessment & Plan Note (Signed)
History of hyperlipidemia currently not on statin therapy with lipid profile performed 09/02/2020 revealing total cholesterol 267, LDL 116 and HDL 49.  I am going to repeat fasting lipid liver profile. ?

## 2022-02-09 NOTE — Assessment & Plan Note (Signed)
History of morbid obesity with a BMI of close to 50.  I am going to refer him to the [facility] for facilitated weight loss. ?

## 2022-02-09 NOTE — Patient Instructions (Addendum)
Medication Instructions:  ?Your physician recommends that you continue on your current medications as directed. Please refer to the Current Medication list given to you today. ? ?*If you need a refill on your cardiac medications before your next appointment, please call your pharmacy* ? ? ?Lab Work: ?Your physician recommends that you return for lab work in: the next week or 2 for FASTING CMET and Lipids ? ?If you have labs (blood work) drawn today and your tests are completely normal, you will receive your results only by: ?MyChart Message (if you have MyChart) OR ?A paper copy in the mail ?If you have any lab test that is abnormal or we need to change your treatment, we will call you to review the results. ? ? ?Testing/Procedures: ?Your physician has requested that you have an echocardiogram. Echocardiography is a painless test that uses sound waves to create images of your heart. It provides your doctor with information about the size and shape of your heart and how well your heart?s chambers and valves are working. This procedure takes approximately one hour. There are no restrictions for this procedure. This procedure will be done at [address] ? ? ?Follow-Up: ?At [facility], you and your health needs are our priority.  As part of our continuing mission to provide you with exceptional heart care, we have created designated Provider Care Teams.  These Care Teams include your primary Cardiologist (physician) and Advanced Practice Providers (APPs -  Physician Assistants and Nurse Practitioners) who all work together to provide you with the care you need, when you need it. ? ?We recommend signing up for the patient portal called "MyChart".  Sign up information is provided on this After Visit Summary.  MyChart is used to connect with patients for Virtual Visits (Telemedicine).  Patients are able to view lab/test results, encounter notes, upcoming appointments, etc.  Non-urgent messages can be  sent to your provider as well.   ?To learn more about what you can do with MyChart, go to [email or web address].   ? ?Your next appointment:   ?4 week(s) ? ?The format for your next appointment:   ?In Person ? ?Provider:   ?Quay Burow, MD ? ? ?Other Instructions ? ? ?Your cardiac CT will be scheduled at the below location:  ? ?[facility] ?[address] ?[place], [place] [address] ?[phone] ? ? ?If scheduled at [facility], please arrive at the [facility] and Children's Entrance (Entrance C2) of [facility] 30 minutes prior to test start time. ?You can use the FREE valet parking offered at entrance C (encouraged to control the heart rate for the test)  ?Proceed to the [facility] Radiology Department (first floor) to check-in and test prep. ? ?All radiology patients and guests should use entrance C2 at [facility], accessed from [facility], even though the hospital's physical address listed is [address] ? ? ? ? ?Please follow these instructions carefully (unless otherwise directed): ? ?Hold all erectile dysfunction medications at least 3 days (72 hrs) prior to test. ? ?On the Night Before the Test: ?Be sure to Drink plenty of water. ?Do not consume any caffeinated/decaffeinated beverages or chocolate 12 hours prior to your test. ?Do not take any antihistamines 12 hours prior to your test. ? ?On the Day of the Test: ?Drink plenty of water until 1 hour prior to the test. ?Do not eat any food 4 hours prior to the test. ?You may take  your regular medications prior to the test.  ?Take metoprolol (Lopressor) two hours prior to test. ?HOLD Furosemide/Hydrochlorothiazide morning of the test. ? ?     ?After the Test: ?Drink plenty of water. ?After receiving IV contrast, you may experience a mild flushed feeling. This is normal. ?On occasion, you may experience a mild rash up to 24 hours after the test. This is not dangerous. If this occurs, you can take  Benadryl 25 mg and increase your fluid intake. ?If you experience trouble breathing, this can be serious. If it is severe call 911 IMMEDIATELY. If it is mild, please call our office. ?If you take any of these medications: Glipizide/Metformin, Avandament, Glucavance, please do not take 48 hours after completing test unless otherwise instructed. ? ?We will call to schedule your test 2-4 weeks out understanding that some insurance companies will need an authorization prior to the service being performed.  ? ?For non-scheduling related questions, please contact the cardiac imaging nurse navigator should you have any questions/concerns: ?Marchia Bond, Cardiac Imaging Nurse Navigator ?Gordy Clement, Cardiac Imaging Nurse Navigator ?[facility] ?Direct Office Dial: [phone]  ? ?For scheduling needs, including cancellations and rescheduling, please call [place], [phone]. ? ?

## 2022-02-09 NOTE — Assessment & Plan Note (Signed)
History of essential hypertension with blood pressure measured today at 148/86.  He is on carvedilol, hydrochlorothiazide and valsartan. ?

## 2022-02-09 NOTE — Assessment & Plan Note (Signed)
Christopher Solomon was referred to me by Etheleen Sia NP for dyspnea on exertion.  He apparently had COVID back in the spring 2021.  He had COVID vaccines after that.  Several months after his COVID infection he was admitted with shortness of breath and was diagnosed with "pericarditis".  He did have a Myoview stress test performed 03/10/2021 which was low risk and nonischemic with an EF of 55 to 65%.  He gets occasional atypical chest pain.  He is never smoked.  I am going get a 2D echo and a coronary CTA to further evaluate. ?

## 2022-02-09 NOTE — Progress Notes (Signed)
? ? ? ?02/09/2022 ?Deirdre Peer Mui   ?November 14, 1980  ?[id number] ? ?Primary Physician Roe Rutherford, NP ?Primary Cardiologist: Runell Gess MD Nicholes Calamity, [place] ? ?HPI:  Christopher Solomon is a 41 y.o. 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  His wife works for [employer].  He was referred by Etheleen Sia NP for dyspnea on exertion.  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  There is no family history of heart disease.  He is never had a heart attack or stroke.  Does have obstructive sleep apnea on CPAP.  He apparently had COVID back in fall 2021 after which he received COVID-vaccine.  2 months later he was hospitalized [place]ness [place]  Apparently he had a diagnosis of pericarditis.  He had a negative Myoview stress test 03/14/2021.  He gets occasional atypical chest pain but complains of dyspnea on exertion.  He does have morbid obesity. ?Current Meds  ?Medication Sig  ? ALPRAZolam (XANAX) 0.5 MG tablet Take 0.5 mg by mouth at bedtime as needed for anxiety.  ? carvedilol (COREG) 3.125 MG tablet Take 3.125 mg by mouth 2 (two) times daily with a meal.  ? Continuous Blood Gluc Transmit (DEXCOM G6 TRANSMITTER) MISC USE TO check sugars 6-8 TIMES DAILY (90 DAY supply)  ? fluticasone (FLONASE) 50 MCG/ACT nasal spray Place 2 sprays into both nostrils as needed.  ? hydrochlorothiazide (MICROZIDE) 12.5 MG capsule Take 12.5 mg by mouth daily.  ? insulin degludec (TRESIBA) 200 UNIT/ML FlexTouch Pen 40 Units. Morning and Night  ? insulin lispro (HUMALOG) 100 UNIT/ML KwikPen INJECT 5-15 UNITS INTO THE SKIN FOUR TIMES DAILY PER sliding scale  ? metFORMIN (GLUCOPHAGE-XR) 500 MG 24 hr tablet Take by mouth.  ? MOUNJARO 2.5 MG/0.5ML Pen SMARTSIG:2.5 Milligram(s) SUB-Q Once a Week  ? omeprazole (PRILOSEC) 40 MG  capsule Take 40 mg by mouth daily.  ? ondansetron (ZOFRAN-ODT) 4 MG disintegrating tablet ALLOW 1 TABLET TO DISSOLVE UNDER THE TONGUE EVERY 8 HOURS AS NEEDED FOR NAUSEA OR VOMITING  ? tiZANidine (ZANAFLEX) 4 MG tablet Take 4 mg by mouth every 6 (six) hours as needed for muscle spasms.  ? triamcinolone cream (KENALOG) 0.1 % Apply to affected areas 2 times daily.  ? valsartan (DIOVAN) 320 MG tablet Take 320 mg by mouth daily.  ?  ? ?Allergies  ?Allergen Reactions  ? Codeine Nausea And Vomiting  ? Metoclopramide Other (See Comments)  ?  Makes him very agitated; can't stop moving. ?Other reaction(s): Other ?Makes him very agitated; can't stop moving.  ? ? ?Social History  ? ?Socioeconomic History  ? Marital status: Married  ?  Spouse name: Christopher Solomon  ? Number of children: 1  ? Years of education: 12th grade  ? Highest education level: Not on file  ?Occupational History  ? Occupation: Surveyor, mining  ?  Comment: [employer]  ?Tobacco Use  ? Smoking status: Former  ?  Types: Cigarettes  ?  Quit date: 10/23/1999  ?  Years since quitting: 22.3  ? Smokeless tobacco: Never  ?Substance and Sexual Activity  ? Alcohol use: No  ?  Alcohol/week: 0.0 standard drinks  ? Drug use: No  ? Sexual activity: Not on file  ?Other Topics Concern  ? Not on file  ?Social History Narrative  ? Lives with his wife and son.  ?  Caffeine: 2 soft drinks/ day  ? Occasional coffee use  ? ?Social Determinants of Health  ? ?Financial Resource Strain: Not on file  ?Food Insecurity: Not on file  ?Transportation Needs: Not on file  ?Physical Activity: Not on file  ?Stress: Not on file  ?Social Connections: Not on file  ?Intimate Partner Violence: Not on file  ?  ? ?Review of Systems: ?General: negative for chills, fever, night sweats or weight changes.  ?Cardiovascular: negative for chest pain, dyspnea on exertion, edema, orthopnea, palpitations, paroxysmal nocturnal dyspnea or shortness of breath ?Dermatological: negative for  rash ?Respiratory: negative for cough or wheezing ?Urologic: negative for hematuria ?Abdominal: negative for nausea, vomiting, diarrhea, bright red blood per rectum, melena, or hematemesis ?Neurologic: negative for visual changes, syncope, or dizziness ?All other systems reviewed and are otherwise negative except as noted above. ? ? ? ?Blood pressure (!) 148/86, pulse 87, height 5\' 9"  (1.753 m), weight (!) 335 lb 3.2 oz (152 kg), SpO2 96 %.  ?General appearance: alert and no distress ?Neck: no adenopathy, no carotid bruit, no JVD, supple, symmetrical, trachea midline, and thyroid not enlarged, symmetric, no tenderness/mass/nodules ?Lungs: clear to auscultation bilaterally ?Heart: regular rate and rhythm, S1, S2 normal, no murmur, click, rub or gallop ?Extremities: extremities normal, atraumatic, no cyanosis or edema ?Pulses: 2+ and symmetric ?Skin: Skin color, texture, turgor normal. No rashes or lesions ?Neurologic: Grossly normal ? ?EKG sinus rhythm at 87 without ST or T wave changes.  I personally reviewed this EKG. ? ?ASSESSMENT AND PLAN:  ? ?Hyperlipidemia ?History of hyperlipidemia currently not on statin therapy with lipid profile performed 09/02/2020 revealing total cholesterol 267, LDL 116 and HDL 49.  I am going to repeat fasting lipid liver profile. ? ?HTN (hypertension) ?History of essential hypertension with blood pressure measured today at 148/86.  He is on carvedilol, hydrochlorothiazide and valsartan. ? ?BMI 45.0-49.9, adult (HCC) ?History of morbid obesity with a BMI of close to 50.  I am going to refer him to the [facility] for facilitated weight loss. ? ?OSA (obstructive sleep apnea) ?History of obstructive sleep apnea on CPAP. ? ?Dyspnea on exertion ?Mr. Schlosser was referred to me by 03-16-1984 NP for dyspnea on exertion.  He apparently had COVID back in the spring 2021.  He had COVID vaccines after that.  Several months after his COVID infection he was admitted with  shortness of breath and was diagnosed with "pericarditis".  He did have a Myoview stress test performed 03/10/2021 which was low risk and nonischemic with an EF of 55 to 65%.  He gets occasional atypical chest pain.  He is never smoked.  I am going get a 2D echo and a coronary CTA to further evaluate. ? ? ? ? ?03/12/2021 MD FACP,FACC,FAHA, FSCAI ?02/09/2022 ?10:04 AM ?

## 2022-02-09 NOTE — Assessment & Plan Note (Signed)
History of obstructive sleep apnea on CPAP. 

## 2022-02-20 ENCOUNTER — Telehealth (HOSPITAL_COMMUNITY): Payer: Self-pay | Admitting: *Deleted

## 2022-02-20 NOTE — Telephone Encounter (Signed)
Attempted to call patient regarding upcoming cardiac CT appointment and to remind patient to obtain labs. Left message on voicemail with name and callback number  Carzell Saldivar RN Navigator Cardiac Imaging [facility] [phone] Office [phone] Cell  

## 2022-02-21 ENCOUNTER — Telehealth (HOSPITAL_COMMUNITY): Payer: Self-pay | Admitting: *Deleted

## 2022-02-21 LAB — COMPREHENSIVE METABOLIC PANEL
ALT: 29 IU/L (ref 0–44)
AST: 28 IU/L (ref 0–40)
Albumin/Globulin Ratio: 1.6 (ref 1.2–2.2)
Albumin: 4.4 g/dL (ref 4.0–5.0)
Alkaline Phosphatase: 84 IU/L (ref 44–121)
BUN/Creatinine Ratio: 23 — ABNORMAL HIGH (ref 9–20)
BUN: 16 mg/dL (ref 6–24)
Bilirubin Total: 0.6 mg/dL (ref 0.0–1.2)
CO2: 22 mmol/L (ref 20–29)
Calcium: 9.6 mg/dL (ref 8.7–10.2)
Chloride: 99 mmol/L (ref 96–106)
Creatinine, Ser: 0.71 mg/dL — ABNORMAL LOW (ref 0.76–1.27)
Globulin, Total: 2.7 g/dL (ref 1.5–4.5)
Glucose: 239 mg/dL — ABNORMAL HIGH (ref 70–99)
Potassium: 4.8 mmol/L (ref 3.5–5.2)
Sodium: 135 mmol/L (ref 134–144)
Total Protein: 7.1 g/dL (ref 6.0–8.5)
eGFR: 117 mL/min/{1.73_m2} (ref >59)

## 2022-02-21 LAB — LIPID PANEL
Chol/HDL Ratio: 3.8 ratio (ref 0.0–5.0)
Cholesterol, Total: 175 mg/dL (ref 100–199)
HDL: 46 mg/dL (ref >39)
LDL Chol Calc (NIH): 98 mg/dL (ref 0–99)
Triglycerides: 180 mg/dL — ABNORMAL HIGH (ref 0–149)
VLDL Cholesterol Cal: 31 mg/dL (ref 5–40)

## 2022-02-21 NOTE — Telephone Encounter (Signed)
Attempted to call patient regarding upcoming cardiac CT appointment. °Left message on voicemail with name and callback number ° °Kenai Fluegel RN Navigator Cardiac Imaging °[facility] °[phone] Office °[phone] Cell ° °

## 2022-02-21 NOTE — Telephone Encounter (Signed)
Patient returning call regarding upcoming cardiac imaging study; pt verbalizes understanding of appt date/time, parking situation and where to check in, pre-test NPO status and medications ordered, and verified current allergies; name and call back number provided for further questions should they arise ? ?Larey Brick RN Navigator Cardiac Imaging ?[facility] ?[phone] office ?[phone] cell ? ?Patient to take 100mg  metoprolol tartrate two hours prior to his cardiac CT scan.  He is aware to arrive at 11:30am. ?

## 2022-02-22 ENCOUNTER — Other Ambulatory Visit: Payer: Self-pay | Admitting: Cardiology

## 2022-02-22 ENCOUNTER — Ambulatory Visit (HOSPITAL_COMMUNITY)
Admission: RE | Admit: 2022-02-22 | Discharge: 2022-02-22 | Disposition: A | Discharge status: Home / Self Care | Payer: 59 | Source: Ambulatory Visit | Attending: Cardiovascular Disease | Admitting: Cardiovascular Disease

## 2022-02-22 ENCOUNTER — Encounter (HOSPITAL_COMMUNITY): Payer: Self-pay

## 2022-02-22 DIAGNOSIS — I10 Essential (primary) hypertension: Secondary | ICD-10-CM | POA: Diagnosis present

## 2022-02-22 DIAGNOSIS — Z6841 Body Mass Index (BMI) 40.0 and over, adult: Secondary | ICD-10-CM | POA: Diagnosis present

## 2022-02-22 DIAGNOSIS — I2510 Atherosclerotic heart disease of native coronary artery without angina pectoris: Secondary | ICD-10-CM

## 2022-02-22 DIAGNOSIS — R0609 Other forms of dyspnea: Secondary | ICD-10-CM | POA: Insufficient documentation

## 2022-02-22 DIAGNOSIS — E782 Mixed hyperlipidemia: Secondary | ICD-10-CM | POA: Diagnosis present

## 2022-02-22 DIAGNOSIS — G4733 Obstructive sleep apnea (adult) (pediatric): Secondary | ICD-10-CM | POA: Diagnosis present

## 2022-02-22 DIAGNOSIS — R931 Abnormal findings on diagnostic imaging of heart and coronary circulation: Secondary | ICD-10-CM

## 2022-02-22 MED ORDER — METOPROLOL TARTRATE 5 MG/5ML IV SOLN: 10.0000 mg | INTRAVENOUS | Status: DC | PRN

## 2022-02-22 MED ORDER — METOPROLOL TARTRATE 5 MG/5ML IV SOLN
INTRAVENOUS | Status: AC
  Administered 2022-02-22: 10 mg via INTRAVENOUS
  Filled 2022-02-22: qty 10

## 2022-02-22 MED ORDER — IOHEXOL 350 MG/ML SOLN
100.0000 mL | Freq: Once | INTRAVENOUS | Status: AC | PRN
  Administered 2022-02-22: 100 mL via INTRAVENOUS

## 2022-02-22 MED ORDER — NITROGLYCERIN 0.4 MG SL SUBL
0.8000 mg | SUBLINGUAL_TABLET | Freq: Once | SUBLINGUAL | Status: AC
  Administered 2022-02-22: 0.8 mg via SUBLINGUAL

## 2022-02-22 MED ORDER — METOPROLOL TARTRATE 5 MG/5ML IV SOLN: 5.0000 mg | INTRAVENOUS | Status: DC | PRN

## 2022-02-22 MED ORDER — NITROGLYCERIN 0.4 MG SL SUBL
SUBLINGUAL_TABLET | SUBLINGUAL | Status: AC
  Filled 2022-02-22: qty 2

## 2022-02-23 ENCOUNTER — Ambulatory Visit (HOSPITAL_BASED_OUTPATIENT_CLINIC_OR_DEPARTMENT_OTHER)
Admission: RE | Admit: 2022-02-23 | Discharge: 2022-02-23 | Disposition: A | Discharge status: Home / Self Care | Payer: 59 | Source: Ambulatory Visit | Attending: Cardiology | Admitting: Cardiology

## 2022-02-23 ENCOUNTER — Ambulatory Visit (HOSPITAL_COMMUNITY): Payer: 59

## 2022-02-23 DIAGNOSIS — E782 Mixed hyperlipidemia: Secondary | ICD-10-CM | POA: Diagnosis not present

## 2022-02-23 DIAGNOSIS — R931 Abnormal findings on diagnostic imaging of heart and coronary circulation: Secondary | ICD-10-CM

## 2022-03-15 ENCOUNTER — Ambulatory Visit: Payer: 59 | Admitting: Cardiovascular Disease

## 2022-03-20 ENCOUNTER — Ambulatory Visit (HOSPITAL_COMMUNITY): Payer: 59 | Attending: Cardiovascular Disease

## 2022-03-20 DIAGNOSIS — R0609 Other forms of dyspnea: Secondary | ICD-10-CM | POA: Diagnosis not present

## 2022-03-20 DIAGNOSIS — I10 Essential (primary) hypertension: Secondary | ICD-10-CM | POA: Insufficient documentation

## 2022-03-20 DIAGNOSIS — G4733 Obstructive sleep apnea (adult) (pediatric): Secondary | ICD-10-CM | POA: Insufficient documentation

## 2022-03-20 LAB — ECHOCARDIOGRAM COMPLETE
Area-P 1/2: 4.49 cm2
S' Lateral: 2.8 cm

## 2022-03-20 MED ORDER — PERFLUTREN LIPID MICROSPHERE
1.0000 mL | INTRAVENOUS | Status: AC | PRN
  Administered 2022-03-20: 3 mL via INTRAVENOUS

## 2022-04-17 ENCOUNTER — Encounter: Payer: Self-pay | Admitting: Cardiovascular Disease

## 2022-04-17 ENCOUNTER — Ambulatory Visit: Payer: 59 | Admitting: Cardiovascular Disease

## 2022-04-17 VITALS — BP 140/80 | HR 91 | Ht 69.0 in | Wt 338.0 lb

## 2022-04-17 DIAGNOSIS — I10 Essential (primary) hypertension: Secondary | ICD-10-CM

## 2022-04-17 DIAGNOSIS — E782 Mixed hyperlipidemia: Secondary | ICD-10-CM

## 2022-04-17 DIAGNOSIS — R931 Abnormal findings on diagnostic imaging of heart and coronary circulation: Secondary | ICD-10-CM

## 2022-04-17 NOTE — Progress Notes (Signed)
Mr. Mondschein returns today for follow-up of his outpatient diagnostic test done in the evaluation of dyspnea.  He had a 2D echocardiogram performed 03/20/2022 which was entirely normal.  He had a coronary calcium score performed 5//23 which was 610 with disease primarily in the LAD and RCA.  FFR analysis did not show that these were significant.  His lipid profile performed 09/02/2020 revealed total cholesterol 267 with an LDL of 116 and HDL 49.  A more recent lipid profile performed 02/21/2022 revealed total cholesterol 175 and LDL 98 and HDL 46.  He was subsequent begun on rosuvastatin 5 mg a day.  We will recheck a lipid liver profile with an LDL goal of less than 70.  I will see him back in 6 months for follow-up.  His EKG today revealed sinus rhythm at 91 with nonspecific ST and T wave changes.  Runell Gess, M.D., FACP, [facility], [facility] [address] [place], [place]  [address]  [phone] 04/17/2022 3:20 PM

## 2022-05-13 IMAGING — MR MR LUMBAR SPINE W/O CM
4 of 5 series · 24 of 48 positions shown · non-contrast
Comparison: Prior radiograph from 01/16/2016.

CLINICAL DATA: Initial evaluation for low back pain with radiation
into the right lower extremity in right buttock with numbness for 5
years.

EXAM:
MRI LUMBAR SPINE WITHOUT CONTRAST
TECHNIQUE: Multiplanar, multisequence MR imaging of the lumbar spine was
performed. No intravenous contrast was administered.

[Series 4: T1 · sagittal · 4.0mm · 0.55mm/px · 5 of 13 slices shown (1 of 2)]
[im 1/13]
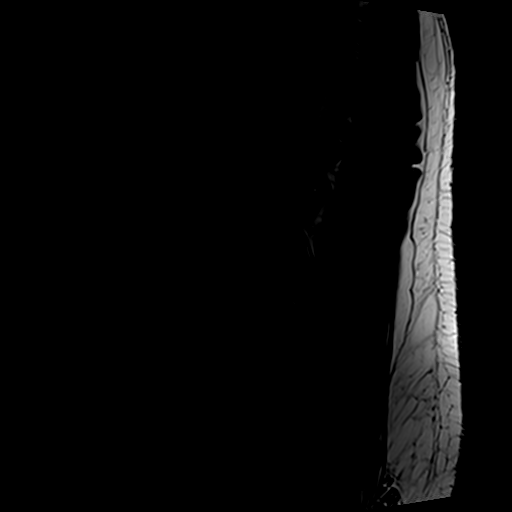
[im 4/13]
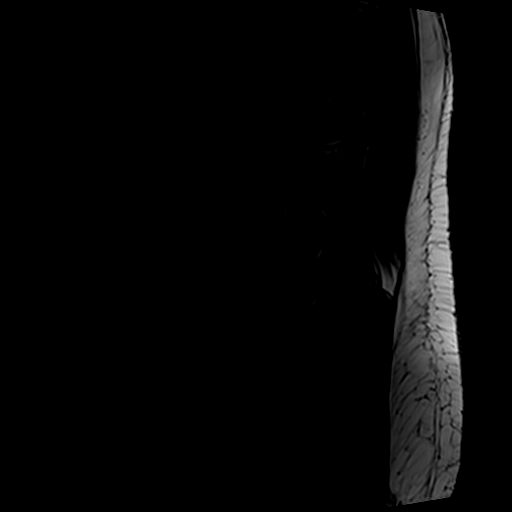
[im 7/13]
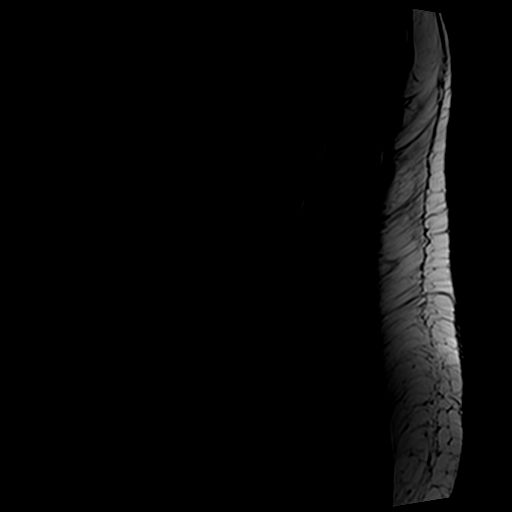
[im 10/13]
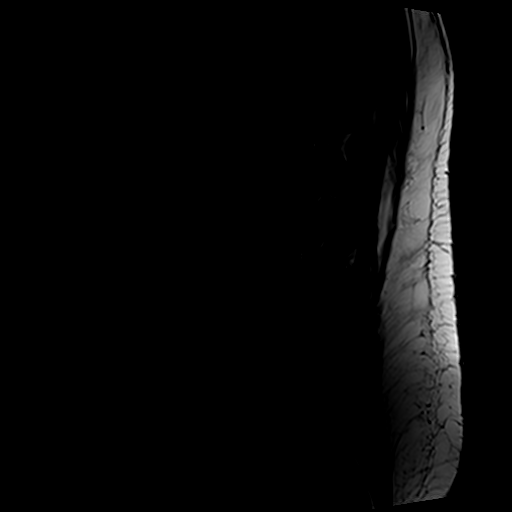
[im 13/13]
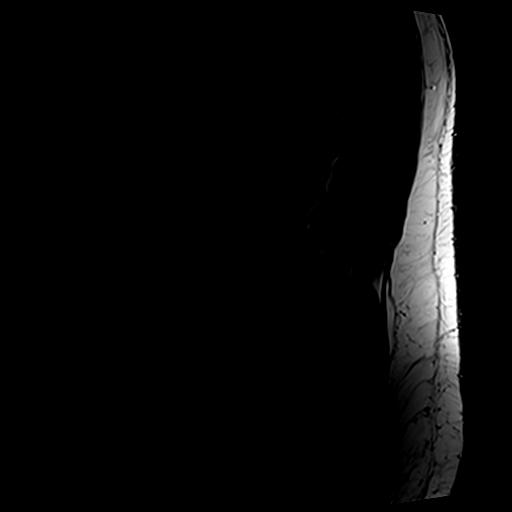

[Series 5: T2 post-contrast · sagittal · 4.0mm · 0.55mm/px · 5 of 13 slices shown]
[im 1/13]
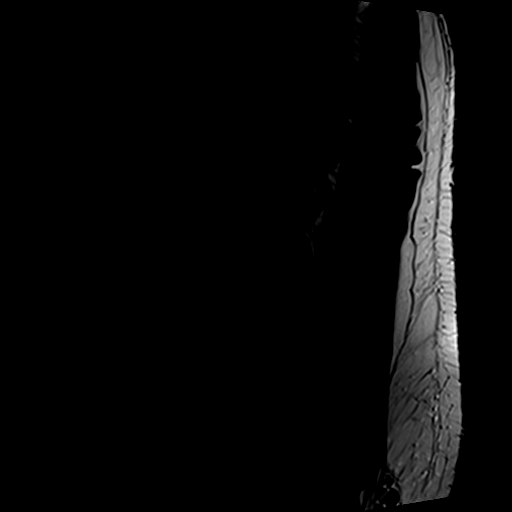
[im 4/13]
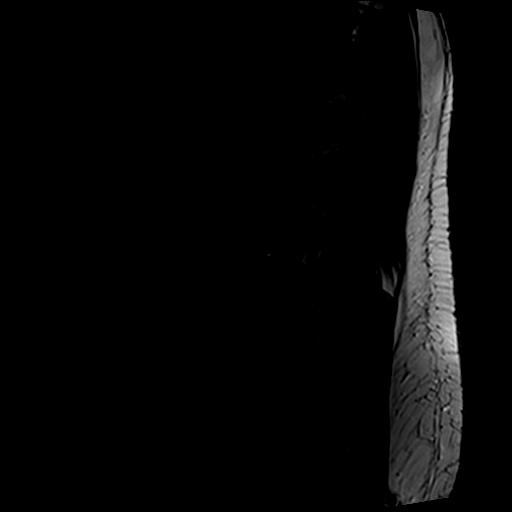
[im 7/13]
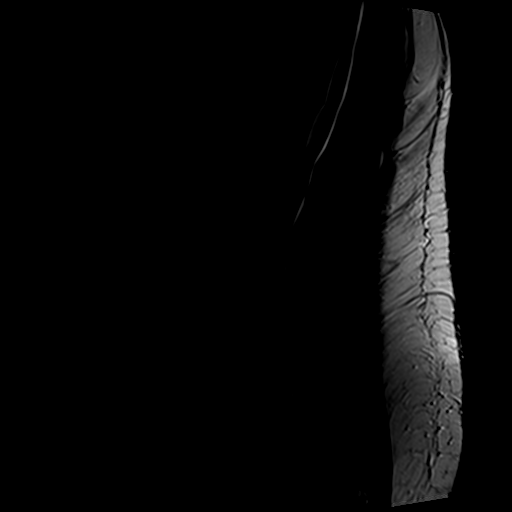
[im 10/13]
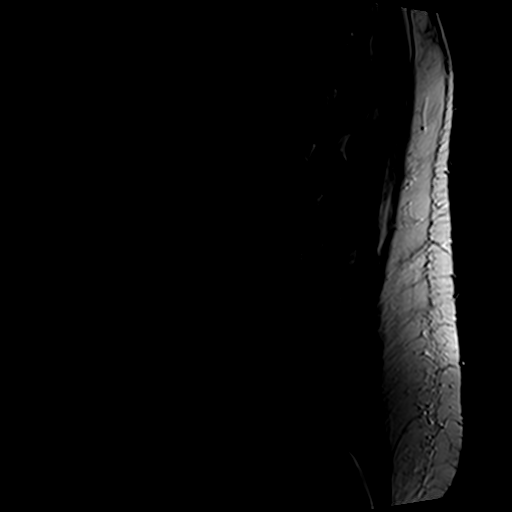
[im 13/13]
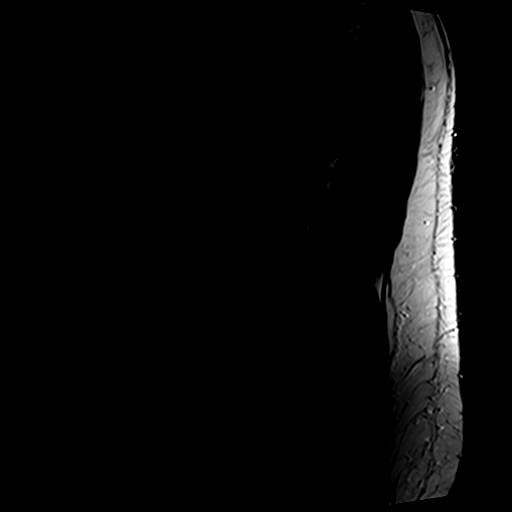

[Series 6: T2 · axial · 4.0mm · 0.70mm/px · z∈[-44,+164]mm · 10 of 40 slices shown]
[im 3/40]
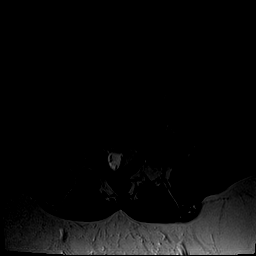
[im 6/40]
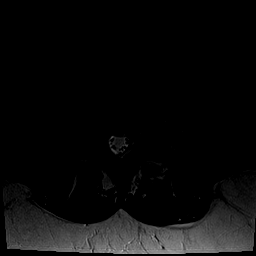
[im 8/40]
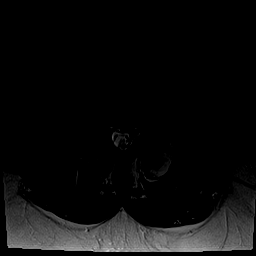
[im 14/40]
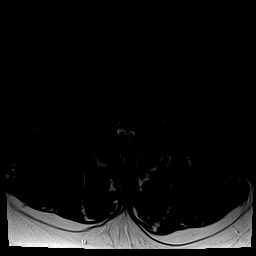
[im 19/40]
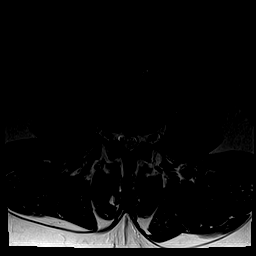
[im 21/40]
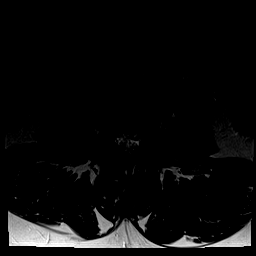
[im 24/40]
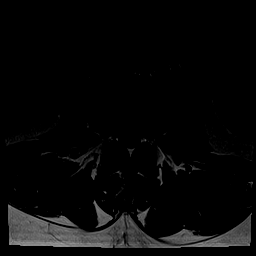
[im 29/40]
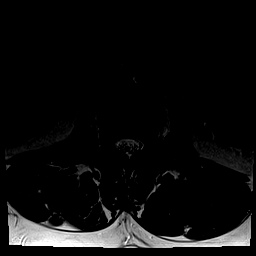
[im 34/40]
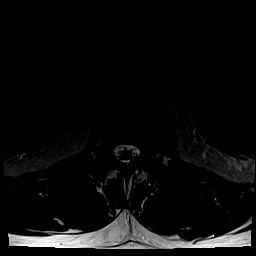
[im 40/40]
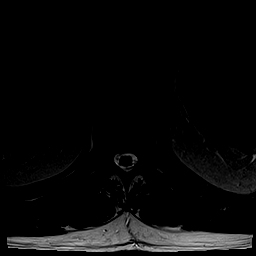

[Series 7: T1 · axial · 4.0mm · 0.35mm/px · z∈[-44,+133]mm · 4 of 40 slices shown (2 of 2)]
[im 3/40]
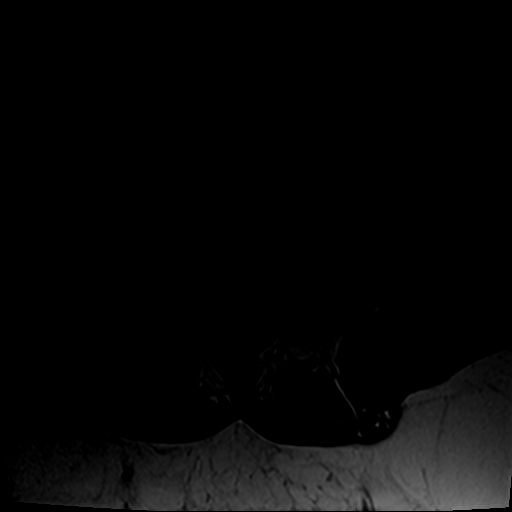
[im 6/40]
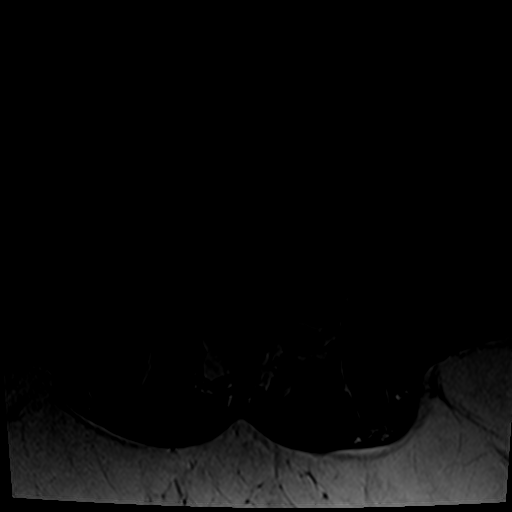
[im 21/40]
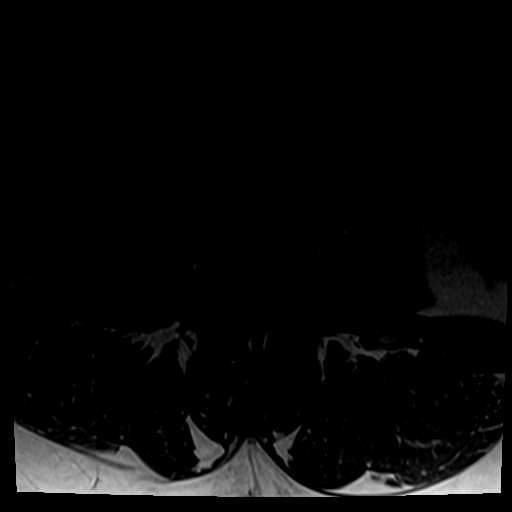
[im 34/40]
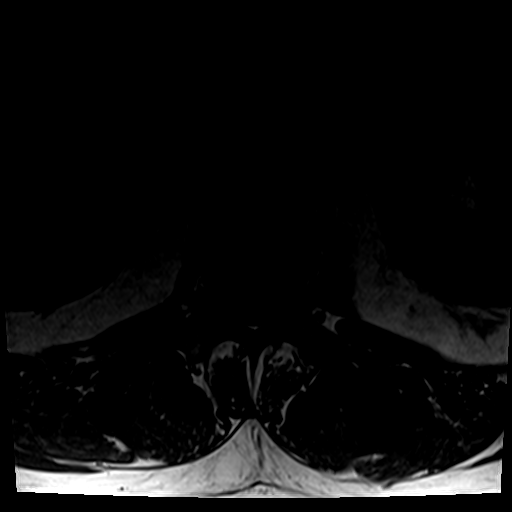

[24 of 48 positions shown; findings below may reference images not displayed]

FINDINGS: Segmentation: Standard. Lowest well-formed disc space labeled the
L5-S1 level.

Alignment: Trace anterolisthesis of L4 on L5, chronic and facet
mediated. Alignment otherwise normal with preservation of the normal
lumbar lordosis.

Vertebrae: Vertebral body height maintained without acute or chronic
fracture. Bone marrow signal intensity within normal limits. No
worrisome osseous lesions. Discogenic reactive endplate changes
noted about the L2-3 and L4-5 interspaces. No other abnormal marrow
edema.

Conus medullaris and cauda equina: Conus extends to the L2 level.
Conus and cauda equina appear normal.

Paraspinal and other soft tissues: Paraspinous soft tissues within
normal limits. Visualized visceral structures within normal limits.

Disc levels:

T11-12: Seen only on sagittal projection. Mild disc bulge with disc
desiccation. Mild facet hypertrophy. Resultant mild spinal stenosis.
Foramina appear patent.

T12-L1: Mild disc bulge with disc desiccation, eccentric to the
left. Mild facet hypertrophy. No significant stenosis.

L1-2: Mild circumferential disc bulge with disc desiccation. Mild to
moderate facet hypertrophy. No significant spinal stenosis. Foramina
remain patent.

L2-3: Mild circumferential disc bulge with disc desiccation.
Superimposed small central disc protrusion with slight inferior
migration. Mild to moderate facet hypertrophy. Changes superimposed
on short pedicles resultant mild-to-moderate spinal stenosis, with
mild narrowing of the right lateral recess. Foramina remain patent.

L3-4: Mild diffuse disc bulge with disc desiccation. Superimposed
small central to right subarticular disc protrusion mildly indents
the right ventral thecal sac (series 6, image 24). Mild to moderate
facet hypertrophy. Underlying short pedicles. Resultant mild spinal
stenosis. Foramina remain patent.

L4-5: Trace anterolisthesis. Diffuse disc bulge with disc
desiccation and intervertebral disc space narrowing. Associated
reactive endplate spurring. There is a superimposed broad right
foraminal to extraforaminal disc protrusion contacting the exiting
right L4 nerve root as it courses of the right neural foramen
(series 6, image 29). Additional small left foraminal disc
protrusion contacts the exiting left L4 nerve root as well (series
5, image 10). Moderate bilateral facet hypertrophy with associated
trace joint effusions. Mild prominence of the dorsal epidural fat.
Resultant mild canal with right lateral recess stenosis. Moderate
bilateral L4 foraminal narrowing.

L5-S1: Disc desiccation without significant disc bulge. Moderate
left worse than right facet hypertrophy. Mild epidural lipomatosis.
No significant spinal stenosis. Mild bilateral L5 foraminal
stenosis.
IMPRESSION: 1. Broad right foraminal to extraforaminal disc protrusion at L4-5,
contacting and potentially irritating the exiting right L4 nerve
root. Finding could contribute to right lower extremity radicular
symptoms.
2. Additional small left foraminal disc protrusion at L4-5,
contacting the exiting left L4 nerve root.
3. Mild to moderate spinal stenosis at L2-3 through L4-5 related to
disc bulge, facet hypertrophy, and short pedicles.
4. Mild-to-moderate bilateral facet hypertrophy throughout the
lumbar spine, which could contribute to lower back pain.

## 2022-10-10 ENCOUNTER — Ambulatory Visit: Payer: 59 | Admitting: Cardiovascular Disease

## 2022-11-30 ENCOUNTER — Ambulatory Visit: Payer: 59 | Admitting: Cardiovascular Disease

## 2023-02-26 ENCOUNTER — Encounter: Payer: Self-pay | Admitting: Cardiovascular Disease

## 2023-02-26 ENCOUNTER — Ambulatory Visit: Payer: 59 | Attending: Cardiovascular Disease | Admitting: Cardiovascular Disease

## 2023-02-26 VITALS — BP 136/84 | HR 72 | Ht 70.0 in | Wt 317.0 lb

## 2023-02-26 DIAGNOSIS — Z6841 Body Mass Index (BMI) 40.0 and over, adult: Secondary | ICD-10-CM

## 2023-02-26 DIAGNOSIS — I10 Essential (primary) hypertension: Secondary | ICD-10-CM | POA: Diagnosis not present

## 2023-02-26 DIAGNOSIS — R0609 Other forms of dyspnea: Secondary | ICD-10-CM | POA: Diagnosis not present

## 2023-02-26 DIAGNOSIS — E782 Mixed hyperlipidemia: Secondary | ICD-10-CM | POA: Diagnosis not present

## 2023-02-26 DIAGNOSIS — R931 Abnormal findings on diagnostic imaging of heart and coronary circulation: Secondary | ICD-10-CM

## 2023-02-26 LAB — LIPID PANEL
Chol/HDL Ratio: 5.4 ratio — ABNORMAL HIGH (ref 0.0–5.0)
Cholesterol, Total: 257 mg/dL — ABNORMAL HIGH (ref 100–199)
HDL: 48 mg/dL (ref >39)
LDL Chol Calc (NIH): 165 mg/dL — ABNORMAL HIGH (ref 0–99)
Triglycerides: 235 mg/dL — ABNORMAL HIGH (ref 0–149)
VLDL Cholesterol Cal: 44 mg/dL — ABNORMAL HIGH (ref 5–40)

## 2023-02-26 LAB — HEPATIC FUNCTION PANEL
ALT: 31 IU/L (ref 0–44)
AST: 25 IU/L (ref 0–40)
Albumin: 4.4 g/dL (ref 4.1–5.1)
Alkaline Phosphatase: 93 IU/L (ref 44–121)
Bilirubin Total: 0.5 mg/dL (ref 0.0–1.2)
Bilirubin, Direct: 0.16 mg/dL (ref 0.00–0.40)
Total Protein: 7.1 g/dL (ref 6.0–8.5)

## 2023-02-26 NOTE — Assessment & Plan Note (Signed)
History of dyspnea legs exertion which I suspect is multifactorial from obesity.  2D echo was normal although coronary calcium score was 610 negative FFR analysis.  He feels better since losing 20 pounds.

## 2023-02-26 NOTE — Assessment & Plan Note (Signed)
History of morbid obesity with a BMI of 45.  He has lost 20 pound since I saw him a year ago as result of diet and exercise.

## 2023-02-26 NOTE — Assessment & Plan Note (Signed)
History of hyperlipidemia on statin therapy which she takes sporadically with lipid profile performed 02/21/2021 revealing total cholesterol 175, LDL 98 and HDL 46, not at goal for secondary prevention.  Will check a fasting lipid liver profile today.

## 2023-02-26 NOTE — Progress Notes (Signed)
02/26/2023 Christopher Solomon   1981/02/12  [id number]  Primary Physician Roe Rutherford, NP Primary Cardiologist: Runell Gess MD Roseanne Reno  HPI:  Christopher Solomon is a 42 y.o.  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 His wife works for [employer]. He was referred by Etheleen Sia NP for dyspnea on exertion.  I last saw him in the office 04/17/2022.  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 There is no family history of heart disease. He is never had a heart attack or stroke. Does have obstructive sleep apnea on CPA, which she wears sporadically.. He apparently had COVID back in fall 2021 after which he received COVID-vaccine. 2 months later he was [place]ness [place] Apparently he had a diagnosis of pericarditis. He had a negative Myoview stress test 03/14/2021. He gets occasional atypical chest pain but complains of dyspnea on exertion. He does have morbid obesity.  Since I saw him a year ago he has lost 20 pounds as a result of diet and exercise.  His breathing has somewhat improved.  He had a 2D echo which was entirely normal 03/20/2022 the coronary CTA showed a coronary calcium score 610 with disease primarily in the LAD and RCA territory although FFR analysis was negative for obstructive disease.  He is on statin therapy for hyperlipidemia and currently is not at goal for secondary prevention.     Current Meds  Medication Sig   ALPRAZolam (XANAX) 0.5 MG tablet Take 0.5 mg by mouth at bedtime as needed for anxiety.   carvedilol (COREG) 3.125 MG tablet Take 3.125 mg by mouth 2 (two) times daily with a meal.   Continuous Blood Gluc Transmit (DEXCOM G6 TRANSMITTER) MISC USE TO check sugars 6-8 TIMES DAILY (90 DAY supply)   fluticasone (FLONASE) 50 MCG/ACT  nasal spray Place 2 sprays into both nostrils as needed.   hydrochlorothiazide (MICROZIDE) 12.5 MG capsule Take 12.5 mg by mouth daily.   insulin degludec (TRESIBA) 200 UNIT/ML FlexTouch Pen 40 Units. Morning and Night   insulin lispro (HUMALOG) 100 UNIT/ML KwikPen INJECT 5-15 UNITS INTO THE SKIN FOUR TIMES DAILY PER sliding scale   MOUNJARO 2.5 MG/0.5ML Pen Inject 10 mg into the skin once a week.   omeprazole (PRILOSEC) 40 MG capsule Take 40 mg by mouth daily.   ondansetron (ZOFRAN-ODT) 4 MG disintegrating tablet ALLOW 1 TABLET TO DISSOLVE UNDER THE TONGUE EVERY 8 HOURS AS NEEDED FOR NAUSEA OR VOMITING   rosuvastatin (CRESTOR) 5 MG tablet Take 5 mg by mouth daily.   tiZANidine (ZANAFLEX) 4 MG tablet Take 4 mg by mouth every 6 (six) hours as needed for muscle spasms.   triamcinolone cream (KENALOG) 0.1 % Apply to affected areas 2 times daily.   valsartan (DIOVAN) 320 MG tablet Take 320 mg by mouth daily.     Allergies  Allergen Reactions   Codeine Nausea And Vomiting   Metoclopramide Other (See Comments)    Makes him very agitated; can't stop moving. Other reaction(s): Other Makes him very agitated; can't stop moving.    Social History   Socioeconomic History   Marital status: Married    Spouse name: [name]   Number of children: 1   Years of education: 12th grade   Highest education level: Not on file  Occupational History   Occupation:  Street Teaching laboratory technician    Comment: [employer]  Tobacco Use   Smoking status: Former    Types: Cigarettes    Quit date: 10/23/1999    Years since quitting: 23.3   Smokeless tobacco: Never  Substance and Sexual Activity   Alcohol use: No    Alcohol/week: 0.0 standard drinks of alcohol   Drug use: No   Sexual activity: Not on file  Other Topics Concern   Not on file  Social History Narrative   Lives with his wife and son.   Caffeine: 2 soft drinks/ day   Occasional coffee use   Social Determinants of Research scientist (physical sciences) Strain: Not on file  Food Insecurity: Not on file  Transportation Needs: Not on file  Physical Activity: Not on file  Stress: Not on file  Social Connections: Not on file  Intimate Partner Violence: Not on file     Review of Systems: General: negative for chills, fever, night sweats or weight changes.  Cardiovascular: negative for chest pain, dyspnea on exertion, edema, orthopnea, palpitations, paroxysmal nocturnal dyspnea or shortness of breath Dermatological: negative for rash Respiratory: negative for cough or wheezing Urologic: negative for hematuria Abdominal: negative for nausea, vomiting, diarrhea, bright red blood per rectum, melena, or hematemesis Neurologic: negative for visual changes, syncope, or dizziness All other systems reviewed and are otherwise negative except as noted above.    Blood pressure 136/84, pulse 72, height 5\' 10"  (1.778 m), weight (!) 317 lb (143.8 kg), SpO2 94 %.  General appearance: alert and no distress Neck: no adenopathy, no carotid bruit, no JVD, supple, symmetrical, trachea midline, and thyroid not enlarged, symmetric, no tenderness/mass/nodules Lungs: clear to auscultation bilaterally Heart: regular rate and rhythm, S1, S2 normal, no murmur, click, rub or gallop Extremities: extremities normal, atraumatic, no cyanosis or edema Pulses: 2+ and symmetric Skin: Skin color, texture, turgor normal. No rashes or lesions Neurologic: Grossly normal  EKG sinus rhythm at 72 without ST or T wave changes.  Personally reviewed this EKG.  ASSESSMENT AND PLAN:   Hyperlipidemia History of hyperlipidemia on statin therapy which she takes sporadically with lipid profile performed 02/21/2021 revealing total cholesterol 175, LDL 98 and HDL 46, not at goal for secondary prevention.  Will check a fasting lipid liver profile today.  HTN (hypertension) History of essential hypertension blood pressure measured today at 136/84.  He is on carvedilol,  hydrochlorothiazide, valsartan and metoprolol.  BMI 45.0-49.9, adult (HCC) History of morbid obesity with a BMI of 45.  He has lost 20 pound since I saw him a year ago as result of diet and exercise.  OSA (obstructive sleep apnea) History of obstructive sleep apnea on CPAP which he wears sporadically.  Dyspnea on exertion History of dyspnea legs exertion which I suspect is multifactorial from obesity.  2D echo was normal although coronary calcium score was 610 negative FFR analysis.  He feels better since losing 20 pounds.  Elevated coronary artery calcium score Coronary calcium score performed 02/23/2022 was 610 with disease primarily in the LAD and RCA.  FFR analysis however did not show significant obstructive disease.  He denies chest pain.  He is not at goal for secondary prevention with LDL above 90.  He is on low-dose statin therapy.6     Runell Gess MD FACP,FACC,FAHA, [facility] 02/26/2023 10:36 AM

## 2023-02-26 NOTE — Patient Instructions (Signed)
Medication Instructions:  Your physician recommends that you continue on your current medications as directed. Please refer to the Current Medication list given to you today.  *If you need a refill on your cardiac medications before your next appointment, please call your pharmacy*   Lab Work: Your physician recommends that you have labs drawn today: Lipid/liver panel  If you have labs (blood work) drawn today and your tests are completely normal, you will receive your results only by: MyChart Message (if you have MyChart) OR A paper copy in the mail If you have any lab test that is abnormal or we need to change your treatment, we will call you to review the results.   Follow-Up: At [facility], you and your health needs are our priority.  As part of our continuing mission to provide you with exceptional heart care, we have created designated Provider Care Teams.  These Care Teams include your primary Cardiologist (physician) and Advanced Practice Providers (APPs -  Physician Assistants and Nurse Practitioners) who all work together to provide you with the care you need, when you need it.  We recommend signing up for the patient portal called "MyChart".  Sign up information is provided on this After Visit Summary.  MyChart is used to connect with patients for Virtual Visits (Telemedicine).  Patients are able to view lab/test results, encounter notes, upcoming appointments, etc.  Non-urgent messages can be sent to your provider as well.   To learn more about what you can do with MyChart, go to [email or web address].    Your next appointment:   12 month(s)  Provider:   Jonathan Berry, MD  

## 2023-02-26 NOTE — Assessment & Plan Note (Signed)
History of obstructive sleep apnea on CPAP which he wears sporadically. 

## 2023-02-26 NOTE — Assessment & Plan Note (Signed)
Coronary calcium score performed 02/23/2022 was 610 with disease primarily in the LAD and RCA.  FFR analysis however did not show significant obstructive disease.  He denies chest pain.  He is not at goal for secondary prevention with LDL above 90.  He is on low-dose statin therapy.6

## 2023-02-26 NOTE — Assessment & Plan Note (Signed)
History of essential hypertension blood pressure measured today at 136/84.  He is on carvedilol, hydrochlorothiazide, valsartan and metoprolol.

## 2023-03-16 ENCOUNTER — Other Ambulatory Visit (HOSPITAL_COMMUNITY): Payer: Self-pay

## 2023-03-25 ENCOUNTER — Other Ambulatory Visit: Payer: Self-pay

## 2023-03-25 MED ORDER — ROSUVASTATIN CALCIUM 40 MG PO TABS
40.0000 mg | ORAL_TABLET | Freq: Every day | ORAL | 3 refills | Status: DC
  Filled 2023-11-11: qty 90, 90d supply, fill #0
  Filled 2023-12-13: qty 30, 30d supply, fill #0

## 2023-03-26 ENCOUNTER — Other Ambulatory Visit (HOSPITAL_COMMUNITY): Payer: Self-pay

## 2023-03-26 MED ORDER — MOUNJARO 5 MG/0.5ML ~~LOC~~ SOAJ
SUBCUTANEOUS | 0 refills | Status: DC
  Filled 2023-03-26: qty 2, 28d supply, fill #0

## 2023-04-22 ENCOUNTER — Other Ambulatory Visit (HOSPITAL_COMMUNITY): Payer: Self-pay

## 2023-04-22 MED ORDER — MOUNJARO 5 MG/0.5ML ~~LOC~~ SOAJ
5.0000 mg | SUBCUTANEOUS | 0 refills | Status: DC
  Filled 2023-04-22: qty 2, 28d supply, fill #0

## 2023-05-20 ENCOUNTER — Encounter (HOSPITAL_BASED_OUTPATIENT_CLINIC_OR_DEPARTMENT_OTHER): Payer: Self-pay | Admitting: Radiology

## 2023-05-20 ENCOUNTER — Emergency Department (HOSPITAL_BASED_OUTPATIENT_CLINIC_OR_DEPARTMENT_OTHER): Payer: 59 | Admitting: Radiology

## 2023-05-20 ENCOUNTER — Other Ambulatory Visit: Payer: Self-pay

## 2023-05-20 ENCOUNTER — Emergency Department (HOSPITAL_BASED_OUTPATIENT_CLINIC_OR_DEPARTMENT_OTHER): Payer: 59

## 2023-05-20 ENCOUNTER — Emergency Department (HOSPITAL_BASED_OUTPATIENT_CLINIC_OR_DEPARTMENT_OTHER)
Admission: EM | Admit: 2023-05-20 | Discharge: 2023-05-20 | Disposition: A | Discharge status: Home / Self Care | Payer: 59 | Attending: Emergency Medicine | Admitting: Emergency Medicine

## 2023-05-20 ENCOUNTER — Other Ambulatory Visit (HOSPITAL_BASED_OUTPATIENT_CLINIC_OR_DEPARTMENT_OTHER): Payer: Self-pay

## 2023-05-20 DIAGNOSIS — Z8616 Personal history of COVID-19: Secondary | ICD-10-CM | POA: Insufficient documentation

## 2023-05-20 DIAGNOSIS — Z79899 Other long term (current) drug therapy: Secondary | ICD-10-CM | POA: Diagnosis not present

## 2023-05-20 DIAGNOSIS — R0602 Shortness of breath: Secondary | ICD-10-CM | POA: Diagnosis present

## 2023-05-20 DIAGNOSIS — N390 Urinary tract infection, site not specified: Secondary | ICD-10-CM | POA: Diagnosis not present

## 2023-05-20 DIAGNOSIS — R109 Unspecified abdominal pain: Secondary | ICD-10-CM

## 2023-05-20 DIAGNOSIS — Z20822 Contact with and (suspected) exposure to covid-19: Secondary | ICD-10-CM | POA: Insufficient documentation

## 2023-05-20 DIAGNOSIS — R519 Headache, unspecified: Secondary | ICD-10-CM | POA: Diagnosis not present

## 2023-05-20 DIAGNOSIS — I10 Essential (primary) hypertension: Secondary | ICD-10-CM | POA: Insufficient documentation

## 2023-05-20 DIAGNOSIS — Z794 Long term (current) use of insulin: Secondary | ICD-10-CM | POA: Diagnosis not present

## 2023-05-20 DIAGNOSIS — E119 Type 2 diabetes mellitus without complications: Secondary | ICD-10-CM | POA: Diagnosis not present

## 2023-05-20 DIAGNOSIS — R0600 Dyspnea, unspecified: Secondary | ICD-10-CM

## 2023-05-20 LAB — HEPATIC FUNCTION PANEL
ALT: 23 U/L (ref 0–44)
AST: 22 U/L (ref 15–41)
Albumin: 4.3 g/dL (ref 3.5–5.0)
Alkaline Phosphatase: 68 U/L (ref 38–126)
Bilirubin, Direct: 0.2 mg/dL (ref 0.0–0.2)
Indirect Bilirubin: 0.5 mg/dL (ref 0.3–0.9)
Total Bilirubin: 0.7 mg/dL (ref 0.3–1.2)
Total Protein: 7.5 g/dL (ref 6.5–8.1)

## 2023-05-20 LAB — URINALYSIS, ROUTINE W REFLEX MICROSCOPIC
Bacteria, UA: NONE SEEN
Bilirubin Urine: NEGATIVE
Glucose, UA: NEGATIVE mg/dL
Ketones, ur: NEGATIVE mg/dL
Nitrite: NEGATIVE
Specific Gravity, Urine: 1.017 (ref 1.005–1.030)
pH: 5.5 (ref 5.0–8.0)

## 2023-05-20 LAB — BASIC METABOLIC PANEL
Anion gap: 11 (ref 5–15)
BUN: 13 mg/dL (ref 6–20)
CO2: 22 mmol/L (ref 22–32)
Calcium: 9.4 mg/dL (ref 8.9–10.3)
Chloride: 102 mmol/L (ref 98–111)
Creatinine, Ser: 0.65 mg/dL (ref 0.61–1.24)
GFR, Estimated: >60 mL/min (ref >60)
Glucose, Bld: 172 mg/dL — ABNORMAL HIGH (ref 70–99)
Potassium: 3.7 mmol/L (ref 3.5–5.1)
Sodium: 135 mmol/L (ref 135–145)

## 2023-05-20 LAB — BRAIN NATRIURETIC PEPTIDE: B Natriuretic Peptide: 47.8 pg/mL (ref 0.0–100.0)

## 2023-05-20 LAB — TROPONIN I (HIGH SENSITIVITY)
Troponin I (High Sensitivity): 3 ng/L (ref <18)
Troponin I (High Sensitivity): 3 ng/L (ref <18)

## 2023-05-20 LAB — CBC
HCT: 42.2 % (ref 39.0–52.0)
Hemoglobin: 15.4 g/dL (ref 13.0–17.0)
MCH: 33 pg (ref 26.0–34.0)
MCHC: 36.5 g/dL — ABNORMAL HIGH (ref 30.0–36.0)
MCV: 90.4 fL (ref 80.0–100.0)
Platelets: 144 10*3/uL — ABNORMAL LOW (ref 150–400)
RBC: 4.67 MIL/uL (ref 4.22–5.81)
RDW: 12 % (ref 11.5–15.5)
WBC: 6.7 10*3/uL (ref 4.0–10.5)
nRBC: 0 % (ref 0.0–0.2)

## 2023-05-20 LAB — SARS CORONAVIRUS 2 BY RT PCR: SARS Coronavirus 2 by RT PCR: NEGATIVE

## 2023-05-20 MED ORDER — DIPHENHYDRAMINE HCL 50 MG/ML IJ SOLN
25.0000 mg | Freq: Once | INTRAMUSCULAR | Status: AC
  Administered 2023-05-20: 25 mg via INTRAVENOUS
  Filled 2023-05-20: qty 1

## 2023-05-20 MED ORDER — IOHEXOL 350 MG/ML SOLN
100.0000 mL | Freq: Once | INTRAVENOUS | Status: AC | PRN
  Administered 2023-05-20: 100 mL via INTRAVENOUS

## 2023-05-20 MED ORDER — CEPHALEXIN 500 MG PO CAPS
500.0000 mg | ORAL_CAPSULE | Freq: Three times a day (TID) | ORAL | 0 refills | Status: AC
  Filled 2023-05-20: qty 21, 7d supply, fill #0

## 2023-05-20 MED ORDER — PROCHLORPERAZINE EDISYLATE 10 MG/2ML IJ SOLN
10.0000 mg | Freq: Once | INTRAMUSCULAR | Status: AC
  Administered 2023-05-20: 10 mg via INTRAVENOUS
  Filled 2023-05-20: qty 2

## 2023-05-20 NOTE — ED Notes (Signed)
Discharge paperwork reviewed entirely with patient, including follow up care. Pain was under control. The patient received instruction and coaching on their prescriptions, and all follow-up questions were answered.  Pt verbalized understanding as well as all parties involved. No questions or concerns voiced at the time of discharge. No acute distress noted.   Pt ambulated out to PVA without incident or assistance.  

## 2023-05-20 NOTE — ED Provider Notes (Signed)
[place] EMERGENCY DEPARTMENT AT [facility] Provider Note   CSN: [id number] Arrival date & time: 05/20/23  1030     History {Add pertinent medical, surgical, social history, OB history to HPI:1} No chief complaint on file.   Christopher Solomon is a 42 y.o. male.  HPI     42yo male with history of DM, htn, hlpd, had PE after COVID and pericarditis but not on anticoagulation now,   Shortness of breath starting last night, nothing making it better or worse, same laying down versus sitting up. Just the same either way. Feels like can't breathe deeply.  No chest pain. When breathe deeply has sharp on right side.  No abdominal pain, just tightness top of stomach.  No nausea or vomiting, no known fevers. Was sweating this AM. Did have one episode of diarrhea.  No leg pain or swelling.  No cough, congestion, sore throat. Nagging cough since COVID 2022 Headache 5-6/10 now, started last night as well, feels like migraines has had in the past, was seeing some squigglies earlier  No smoking, etoh, other drugs Does fam hx of early heart disease, grandpas, uncles.   Father was killed when he was 20 so unknown  No hx of long trips, recent surgeries, immobilization   Past Medical History:  Diagnosis Date  . Diabetes mellitus without complication (HCC)   . Headaches, cluster    tension  . High cholesterol   . Hypertension      Home Medications Prior to Admission medications   Medication Sig Start Date End Date Taking? Authorizing Provider  ALPRAZolam Prudy Feeler) 0.5 MG tablet Take 0.5 mg by mouth at bedtime as needed for anxiety.    [provider]  carvedilol (COREG) 3.125 MG tablet Take 3.125 mg by mouth 2 (two) times daily with a meal.    [provider]  Continuous Blood Gluc Transmit (DEXCOM G6 TRANSMITTER) MISC USE TO check sugars 6-8 TIMES DAILY (90 DAY supply) 10/31/21   [provider]  fluticasone (FLONASE) 50 MCG/ACT nasal spray Place 2 sprays  into both nostrils as needed. 12/23/14   [provider]  hydrochlorothiazide (MICROZIDE) 12.5 MG capsule Take 12.5 mg by mouth daily.    [provider]  insulin degludec (TRESIBA) 200 UNIT/ML FlexTouch Pen 40 Units. Morning and Night 06/05/21   [provider]  insulin lispro (HUMALOG) 100 UNIT/ML KwikPen INJECT 5-15 UNITS INTO THE SKIN FOUR TIMES DAILY PER sliding scale 08/14/21   [provider]  metFORMIN (GLUCOPHAGE-XR) 500 MG 24 hr tablet Take by mouth. Patient not taking: Reported on 02/26/2023 11/20/21   [provider]  metoprolol tartrate (LOPRESSOR) 100 MG tablet Take 1 tablet (100 mg total) by mouth once for 1 dose. Take 2 hours prior to procedure. 02/09/22 02/09/22  Runell Gess, MD  MOUNJARO 2.5 MG/0.5ML Pen Inject 10 mg into the skin once a week. 11/22/21   [provider]  omeprazole (PRILOSEC) 40 MG capsule Take 40 mg by mouth daily.    [provider]  ondansetron (ZOFRAN-ODT) 4 MG disintegrating tablet ALLOW 1 TABLET TO DISSOLVE UNDER THE TONGUE EVERY 8 HOURS AS NEEDED FOR NAUSEA OR VOMITING 01/31/16   Anson Fret, MD  rosuvastatin (CRESTOR) 40 MG tablet Take 1 tablet (40 mg total) by mouth daily. 03/25/23   Runell Gess, MD  tirzepatide [facility]) 5 MG/0.5ML Pen Inject 5 mg into the skin once a week. 04/22/23     tiZANidine (ZANAFLEX) 4 MG tablet Take 4  mg by mouth every 6 (six) hours as needed for muscle spasms.    [provider]  triamcinolone cream (KENALOG) 0.1 % Apply to affected areas 2 times daily. 05/31/21     valsartan (DIOVAN) 320 MG tablet Take 320 mg by mouth daily. 01/25/22   [provider]      Allergies    Codeine and Metoclopramide    Review of Systems   Review of Systems  Physical Exam Updated Vital Signs BP (!) 145/83   Pulse 91   Temp 98.6 F (37 C)   Resp 16   Ht 5\' 10"  (1.778 m)   Wt (!) 142.9 kg   SpO2 97%   BMI 45.20 kg/m  Physical Exam  ED Results /  Procedures / Treatments   Labs (all labs ordered are listed, but only abnormal results are displayed) Labs Reviewed  BASIC METABOLIC PANEL - Abnormal; Notable for the following components:      Result Value   Glucose, Bld 172 (*)    All other components within normal limits  CBC - Abnormal; Notable for the following components:   MCHC 36.5 (*)    Platelets 144 (*)    All other components within normal limits  BRAIN NATRIURETIC PEPTIDE  TROPONIN I (HIGH SENSITIVITY)  TROPONIN I (HIGH SENSITIVITY)    EKG EKG Interpretation Date/Time:  Monday May 20 2023 10:41:09 EDT Ventricular Rate:  91 PR Interval:  174 QRS Duration:  90 QT Interval:  358 QTC Calculation: 440 R Axis:   13  Text Interpretation: Normal sinus rhythm Anterior infarct (cited on or before 15-Feb-2021) Abnormal ECG When compared with ECG of 15-Feb-2021 16:20, Questionable change in initial forces of Anterior leads No significant change since last tracing Confirmed by Alvira Monday ([id number]) on 05/20/2023 10:53:31 AM  Radiology DG Chest 2 View  Result Date: 05/20/2023 CLINICAL DATA:  shortness of breath EXAM: CHEST - 2 VIEW COMPARISON:  02/15/2021. FINDINGS: Bilateral lung fields are clear. Bilateral costophrenic angles are clear. Normal cardio-mediastinal silhouette. No acute osseous abnormalities. The soft tissues are within normal limits. IMPRESSION: No active cardiopulmonary disease. Electronically Signed   By: Jules Schick M.D.   On: 05/20/2023 11:42    Procedures Procedures  {Document cardiac monitor, telemetry assessment procedure when appropriate:1}  Medications Ordered in ED Medications - No data to display  ED Course/ Medical Decision Making/ A&P   {   Click here for ABCD2, HEART and other calculatorsREFRESH Note before signing :1}                          Medical Decision Making Amount and/or Complexity of Data Reviewed Labs: ordered. Radiology: ordered.  Risk Prescription drug  management.   ***  {Document critical care time when appropriate:1} {Document review of labs and clinical decision tools ie heart score, Chads2Vasc2 etc:1}  {Document your independent review of radiology images, and any outside records:1} {Document your discussion with family members, caretakers, and with consultants:1} {Document social determinants of health affecting pt's care:1} {Document your decision making why or why not admission, treatments were needed:1} Final Clinical Impression(s) / ED Diagnoses Final diagnoses:  None    Rx / DC Orders ED Discharge Orders     None

## 2023-05-20 NOTE — ED Triage Notes (Signed)
Pt presents with headache and shortness of muscle. Reports he went to health center at work and they told heart tones were muffled. Heart tones auscultated at triage normal, S1,S2 noted. Pt reports tightness in chest, EKG performed at triage. History of pericarditis and blood clots with Covid in May 2022

## 2023-05-27 ENCOUNTER — Other Ambulatory Visit (HOSPITAL_COMMUNITY): Payer: Self-pay

## 2023-05-27 MED ORDER — MOUNJARO 7.5 MG/0.5ML ~~LOC~~ SOAJ
7.5000 mg | SUBCUTANEOUS | 3 refills | Status: DC
  Filled 2023-05-27: qty 2, 28d supply, fill #0

## 2023-06-11 ENCOUNTER — Encounter (HOSPITAL_BASED_OUTPATIENT_CLINIC_OR_DEPARTMENT_OTHER): Payer: Self-pay

## 2023-06-19 ENCOUNTER — Other Ambulatory Visit (HOSPITAL_COMMUNITY): Payer: Self-pay

## 2023-06-19 MED ORDER — MOUNJARO 10 MG/0.5ML ~~LOC~~ SOAJ
10.0000 mg | SUBCUTANEOUS | 3 refills | Status: DC
  Filled 2023-06-19 – 2023-06-29 (×2): qty 2, 28d supply, fill #0

## 2023-06-28 ENCOUNTER — Other Ambulatory Visit (HOSPITAL_COMMUNITY): Payer: Self-pay

## 2023-06-29 ENCOUNTER — Other Ambulatory Visit (HOSPITAL_COMMUNITY): Payer: Self-pay

## 2023-07-25 ENCOUNTER — Other Ambulatory Visit (HOSPITAL_COMMUNITY): Payer: Self-pay

## 2023-07-29 ENCOUNTER — Other Ambulatory Visit (HOSPITAL_COMMUNITY): Payer: Self-pay

## 2023-07-29 ENCOUNTER — Encounter (HOSPITAL_COMMUNITY): Payer: Self-pay | Admitting: Pharmacist

## 2023-07-29 MED ORDER — ZEPBOUND 12.5 MG/0.5ML ~~LOC~~ SOAJ
12.5000 mg | SUBCUTANEOUS | 0 refills | Status: DC
  Filled 2023-07-29: qty 2, 28d supply, fill #0

## 2023-08-02 ENCOUNTER — Other Ambulatory Visit (HOSPITAL_COMMUNITY): Payer: Self-pay

## 2023-08-02 MED ORDER — TIRZEPATIDE-WEIGHT MANAGEMENT 12.5 MG/0.5ML ~~LOC~~ SOAJ
12.5000 mg | SUBCUTANEOUS | 0 refills | Status: DC
  Filled 2023-08-02: qty 2, 28d supply, fill #0

## 2023-08-05 ENCOUNTER — Other Ambulatory Visit (HOSPITAL_COMMUNITY): Payer: Self-pay

## 2023-09-04 ENCOUNTER — Other Ambulatory Visit: Payer: Self-pay

## 2023-09-04 ENCOUNTER — Other Ambulatory Visit (HOSPITAL_COMMUNITY): Payer: Self-pay

## 2023-09-04 MED ORDER — ZEPBOUND 12.5 MG/0.5ML ~~LOC~~ SOAJ
12.5000 mg | SUBCUTANEOUS | 0 refills | Status: DC
  Filled 2023-09-04: qty 2, 28d supply, fill #0

## 2023-09-05 ENCOUNTER — Other Ambulatory Visit (HOSPITAL_COMMUNITY): Payer: Self-pay

## 2023-09-19 ENCOUNTER — Other Ambulatory Visit: Payer: Self-pay

## 2023-09-19 ENCOUNTER — Observation Stay (HOSPITAL_COMMUNITY): Payer: 59

## 2023-09-19 ENCOUNTER — Emergency Department (HOSPITAL_COMMUNITY): Payer: 59

## 2023-09-19 ENCOUNTER — Inpatient Hospital Stay (HOSPITAL_COMMUNITY)
Admission: EM | Admit: 2023-09-19 | Discharge: 2023-09-21 | DRG: 101 | Disposition: A | Discharge status: Home / Self Care | Payer: 59 | Attending: Internal Medicine | Admitting: Internal Medicine

## 2023-09-19 DIAGNOSIS — E7800 Pure hypercholesterolemia, unspecified: Secondary | ICD-10-CM | POA: Diagnosis present

## 2023-09-19 DIAGNOSIS — G40909 Epilepsy, unspecified, not intractable, without status epilepticus: Secondary | ICD-10-CM | POA: Diagnosis not present

## 2023-09-19 DIAGNOSIS — I5A Non-ischemic myocardial injury (non-traumatic): Secondary | ICD-10-CM

## 2023-09-19 DIAGNOSIS — R55 Syncope and collapse: Secondary | ICD-10-CM | POA: Diagnosis not present

## 2023-09-19 DIAGNOSIS — E662 Morbid (severe) obesity with alveolar hypoventilation: Secondary | ICD-10-CM | POA: Diagnosis present

## 2023-09-19 DIAGNOSIS — G44009 Cluster headache syndrome, unspecified, not intractable: Secondary | ICD-10-CM | POA: Diagnosis present

## 2023-09-19 DIAGNOSIS — Z885 Allergy status to narcotic agent status: Secondary | ICD-10-CM

## 2023-09-19 DIAGNOSIS — Y92009 Unspecified place in unspecified non-institutional (private) residence as the place of occurrence of the external cause: Secondary | ICD-10-CM

## 2023-09-19 DIAGNOSIS — E7849 Other hyperlipidemia: Secondary | ICD-10-CM

## 2023-09-19 DIAGNOSIS — K219 Gastro-esophageal reflux disease without esophagitis: Secondary | ICD-10-CM | POA: Diagnosis present

## 2023-09-19 DIAGNOSIS — D696 Thrombocytopenia, unspecified: Secondary | ICD-10-CM | POA: Diagnosis present

## 2023-09-19 DIAGNOSIS — Z6841 Body Mass Index (BMI) 40.0 and over, adult: Secondary | ICD-10-CM

## 2023-09-19 DIAGNOSIS — G43101 Migraine with aura, not intractable, with status migrainosus: Secondary | ICD-10-CM | POA: Diagnosis present

## 2023-09-19 DIAGNOSIS — R569 Unspecified convulsions: Secondary | ICD-10-CM | POA: Diagnosis not present

## 2023-09-19 DIAGNOSIS — E785 Hyperlipidemia, unspecified: Secondary | ICD-10-CM | POA: Diagnosis present

## 2023-09-19 DIAGNOSIS — E119 Type 2 diabetes mellitus without complications: Secondary | ICD-10-CM

## 2023-09-19 DIAGNOSIS — S43014A Anterior dislocation of right humerus, initial encounter: Principal | ICD-10-CM | POA: Diagnosis present

## 2023-09-19 DIAGNOSIS — Z79899 Other long term (current) drug therapy: Secondary | ICD-10-CM

## 2023-09-19 DIAGNOSIS — Z7985 Long-term (current) use of injectable non-insulin antidiabetic drugs: Secondary | ICD-10-CM

## 2023-09-19 DIAGNOSIS — G4733 Obstructive sleep apnea (adult) (pediatric): Secondary | ICD-10-CM | POA: Diagnosis present

## 2023-09-19 DIAGNOSIS — E1165 Type 2 diabetes mellitus with hyperglycemia: Secondary | ICD-10-CM | POA: Diagnosis present

## 2023-09-19 DIAGNOSIS — R7989 Other specified abnormal findings of blood chemistry: Secondary | ICD-10-CM

## 2023-09-19 DIAGNOSIS — S43004A Unspecified dislocation of right shoulder joint, initial encounter: Principal | ICD-10-CM

## 2023-09-19 DIAGNOSIS — I2489 Other forms of acute ischemic heart disease: Secondary | ICD-10-CM | POA: Diagnosis present

## 2023-09-19 DIAGNOSIS — I2510 Atherosclerotic heart disease of native coronary artery without angina pectoris: Secondary | ICD-10-CM | POA: Diagnosis not present

## 2023-09-19 DIAGNOSIS — W19XXXA Unspecified fall, initial encounter: Secondary | ICD-10-CM | POA: Diagnosis present

## 2023-09-19 DIAGNOSIS — F05 Delirium due to known physiological condition: Secondary | ICD-10-CM | POA: Diagnosis present

## 2023-09-19 DIAGNOSIS — Z87891 Personal history of nicotine dependence: Secondary | ICD-10-CM

## 2023-09-19 DIAGNOSIS — R931 Abnormal findings on diagnostic imaging of heart and coronary circulation: Secondary | ICD-10-CM

## 2023-09-19 DIAGNOSIS — R0609 Other forms of dyspnea: Secondary | ICD-10-CM

## 2023-09-19 DIAGNOSIS — Z7984 Long term (current) use of oral hypoglycemic drugs: Secondary | ICD-10-CM

## 2023-09-19 DIAGNOSIS — Z794 Long term (current) use of insulin: Secondary | ICD-10-CM

## 2023-09-19 DIAGNOSIS — E871 Hypo-osmolality and hyponatremia: Secondary | ICD-10-CM | POA: Diagnosis present

## 2023-09-19 DIAGNOSIS — R2681 Unsteadiness on feet: Secondary | ICD-10-CM | POA: Diagnosis present

## 2023-09-19 DIAGNOSIS — I10 Essential (primary) hypertension: Secondary | ICD-10-CM | POA: Diagnosis not present

## 2023-09-19 DIAGNOSIS — G40109 Localization-related (focal) (partial) symptomatic epilepsy and epileptic syndromes with simple partial seizures, not intractable, without status epilepticus: Secondary | ICD-10-CM

## 2023-09-19 DIAGNOSIS — Z833 Family history of diabetes mellitus: Secondary | ICD-10-CM

## 2023-09-19 DIAGNOSIS — Z888 Allergy status to other drugs, medicaments and biological substances status: Secondary | ICD-10-CM

## 2023-09-19 LAB — CBC WITH DIFFERENTIAL/PLATELET
Abs Immature Granulocytes: 0.05 10*3/uL (ref 0.00–0.07)
Basophils Absolute: 0 10*3/uL (ref 0.0–0.1)
Basophils Relative: 0 %
Eosinophils Absolute: 0.1 10*3/uL (ref 0.0–0.5)
Eosinophils Relative: 1 %
HCT: 47.4 % (ref 39.0–52.0)
Hemoglobin: 16.4 g/dL (ref 13.0–17.0)
Immature Granulocytes: 1 %
Lymphocytes Relative: 10 %
Lymphs Abs: 1 10*3/uL (ref 0.7–4.0)
MCH: 32.1 pg (ref 26.0–34.0)
MCHC: 34.6 g/dL (ref 30.0–36.0)
MCV: 92.8 fL (ref 80.0–100.0)
Monocytes Absolute: 0.5 10*3/uL (ref 0.1–1.0)
Monocytes Relative: 5 %
Neutro Abs: 8.1 10*3/uL — ABNORMAL HIGH (ref 1.7–7.7)
Neutrophils Relative %: 83 %
Platelets: 138 10*3/uL — ABNORMAL LOW (ref 150–400)
RBC: 5.11 MIL/uL (ref 4.22–5.81)
RDW: 12.1 % (ref 11.5–15.5)
WBC: 9.6 10*3/uL (ref 4.0–10.5)
nRBC: 0 % (ref 0.0–0.2)

## 2023-09-19 LAB — HIV ANTIBODY (ROUTINE TESTING W REFLEX): HIV Screen 4th Generation wRfx: NONREACTIVE

## 2023-09-19 LAB — CBG MONITORING, ED: Glucose-Capillary: 296 mg/dL — ABNORMAL HIGH (ref 70–99)

## 2023-09-19 LAB — HEMOGLOBIN A1C
Hgb A1c MFr Bld: 8.2 % — ABNORMAL HIGH (ref 4.8–5.6)
Mean Plasma Glucose: 188.64 mg/dL

## 2023-09-19 LAB — COMPREHENSIVE METABOLIC PANEL
ALT: 31 U/L (ref 0–44)
AST: 27 U/L (ref 15–41)
Albumin: 4.1 g/dL (ref 3.5–5.0)
Alkaline Phosphatase: 69 U/L (ref 38–126)
Anion gap: 12 (ref 5–15)
BUN: 15 mg/dL (ref 6–20)
CO2: 24 mmol/L (ref 22–32)
Calcium: 9.5 mg/dL (ref 8.9–10.3)
Chloride: 98 mmol/L (ref 98–111)
Creatinine, Ser: 0.76 mg/dL (ref 0.61–1.24)
GFR, Estimated: >60 mL/min (ref >60)
Glucose, Bld: 282 mg/dL — ABNORMAL HIGH (ref 70–99)
Potassium: 4.5 mmol/L (ref 3.5–5.1)
Sodium: 134 mmol/L — ABNORMAL LOW (ref 135–145)
Total Bilirubin: 0.8 mg/dL (ref <1.2)
Total Protein: 7.6 g/dL (ref 6.5–8.1)

## 2023-09-19 LAB — GLUCOSE, CAPILLARY
Glucose-Capillary: 221 mg/dL — ABNORMAL HIGH (ref 70–99)
Glucose-Capillary: 299 mg/dL — ABNORMAL HIGH (ref 70–99)
Glucose-Capillary: 278 mg/dL — ABNORMAL HIGH (ref 70–99)
Glucose-Capillary: 274 mg/dL — ABNORMAL HIGH (ref 70–99)

## 2023-09-19 LAB — CK: Total CK: 891 U/L — ABNORMAL HIGH (ref 49–397)

## 2023-09-19 LAB — TROPONIN I (HIGH SENSITIVITY)
Troponin I (High Sensitivity): 10 ng/L (ref <18)
Troponin I (High Sensitivity): 147 ng/L (ref <18)
Troponin I (High Sensitivity): 294 ng/L (ref <18)
Troponin I (High Sensitivity): 302 ng/L (ref <18)

## 2023-09-19 LAB — MAGNESIUM: Magnesium: 1.7 mg/dL (ref 1.7–2.4)

## 2023-09-19 MED ORDER — ONDANSETRON HCL 4 MG PO TABS: 4.0000 mg | ORAL_TABLET | Freq: Four times a day (QID) | ORAL | Status: DC | PRN

## 2023-09-19 MED ORDER — HYDROMORPHONE HCL 1 MG/ML IJ SOLN
0.5000 mg | INTRAMUSCULAR | Status: DC | PRN
  Administered 2023-09-19 – 2023-09-20 (×4): 1 mg via INTRAVENOUS
  Filled 2023-09-19 (×4): qty 1

## 2023-09-19 MED ORDER — IRBESARTAN 150 MG PO TABS
300.0000 mg | ORAL_TABLET | Freq: Every day | ORAL | Status: DC
  Administered 2023-09-20 – 2023-09-21 (×2): 300 mg via ORAL
  Filled 2023-09-19 (×2): qty 2

## 2023-09-19 MED ORDER — LORAZEPAM 2 MG/ML IJ SOLN: 2.0000 mg | INTRAMUSCULAR | Status: DC | PRN

## 2023-09-19 MED ORDER — HYDROMORPHONE HCL 1 MG/ML IJ SOLN
1.0000 mg | Freq: Once | INTRAMUSCULAR | Status: AC
  Administered 2023-09-19: 1 mg via INTRAVENOUS
  Filled 2023-09-19: qty 1

## 2023-09-19 MED ORDER — LEVETIRACETAM IN NACL 1500 MG/100ML IV SOLN
1500.0000 mg | Freq: Once | INTRAVENOUS | Status: AC
  Administered 2023-09-19: 1500 mg via INTRAVENOUS
  Filled 2023-09-19: qty 100

## 2023-09-19 MED ORDER — PANTOPRAZOLE SODIUM 40 MG PO TBEC
40.0000 mg | DELAYED_RELEASE_TABLET | Freq: Every day | ORAL | Status: DC
  Administered 2023-09-20 – 2023-09-21 (×2): 40 mg via ORAL
  Filled 2023-09-19 (×2): qty 1

## 2023-09-19 MED ORDER — HYDRALAZINE HCL 20 MG/ML IJ SOLN
10.0000 mg | Freq: Four times a day (QID) | INTRAMUSCULAR | Status: DC | PRN
  Administered 2023-09-19: 10 mg via INTRAVENOUS
  Filled 2023-09-19: qty 1

## 2023-09-19 MED ORDER — ETOMIDATE 2 MG/ML IV SOLN
10.0000 mg | Freq: Once | INTRAVENOUS | Status: AC
  Administered 2023-09-19: 10 mg via INTRAVENOUS
  Filled 2023-09-19: qty 10

## 2023-09-19 MED ORDER — ALPRAZOLAM 0.5 MG PO TABS
0.5000 mg | ORAL_TABLET | Freq: Every evening | ORAL | Status: DC | PRN
  Administered 2023-09-21: 0.5 mg via ORAL
  Filled 2023-09-19: qty 1

## 2023-09-19 MED ORDER — INSULIN GLARGINE-YFGN 100 UNIT/ML ~~LOC~~ SOLN
40.0000 [IU] | Freq: Two times a day (BID) | SUBCUTANEOUS | Status: DC
  Administered 2023-09-19 – 2023-09-21 (×4): 40 [IU] via SUBCUTANEOUS
  Filled 2023-09-19 (×7): qty 0.4

## 2023-09-19 MED ORDER — LEVETIRACETAM IN NACL 1000 MG/100ML IV SOLN
1000.0000 mg | Freq: Two times a day (BID) | INTRAVENOUS | Status: DC
  Filled 2023-09-19: qty 100

## 2023-09-19 MED ORDER — CARVEDILOL 3.125 MG PO TABS
3.1250 mg | ORAL_TABLET | Freq: Once | ORAL | Status: AC
  Administered 2023-09-19: 3.125 mg via ORAL
  Filled 2023-09-19: qty 1

## 2023-09-19 MED ORDER — ROSUVASTATIN CALCIUM 20 MG PO TABS
40.0000 mg | ORAL_TABLET | Freq: Every day | ORAL | Status: DC
  Administered 2023-09-20: 40 mg via ORAL
  Filled 2023-09-19: qty 2

## 2023-09-19 MED ORDER — SODIUM CHLORIDE 0.9 % IV SOLN
750.0000 mg | Freq: Two times a day (BID) | INTRAVENOUS | Status: DC
  Filled 2023-09-19 (×4): qty 7.5

## 2023-09-19 MED ORDER — ONDANSETRON HCL 4 MG/2ML IJ SOLN: 4.0000 mg | Freq: Four times a day (QID) | INTRAMUSCULAR | Status: DC | PRN

## 2023-09-19 MED ORDER — LORAZEPAM 2 MG/ML IJ SOLN
2.0000 mg | INTRAMUSCULAR | Status: DC | PRN
  Administered 2023-09-19: 2 mg via INTRAVENOUS
  Filled 2023-09-19: qty 1

## 2023-09-19 MED ORDER — SODIUM CHLORIDE 0.9 % IV SOLN: INTRAVENOUS | Status: DC

## 2023-09-19 MED ORDER — TIZANIDINE HCL 4 MG PO TABS: 4.0000 mg | ORAL_TABLET | Freq: Four times a day (QID) | ORAL | Status: DC | PRN

## 2023-09-19 MED ORDER — CARVEDILOL 3.125 MG PO TABS
3.1250 mg | ORAL_TABLET | Freq: Two times a day (BID) | ORAL | Status: DC
  Administered 2023-09-20 – 2023-09-21 (×3): 3.125 mg via ORAL
  Filled 2023-09-19 (×3): qty 1

## 2023-09-19 MED ORDER — ONDANSETRON HCL 4 MG/2ML IJ SOLN
4.0000 mg | Freq: Once | INTRAMUSCULAR | Status: AC
  Administered 2023-09-19: 4 mg via INTRAVENOUS
  Filled 2023-09-19: qty 2

## 2023-09-19 MED ORDER — ACETAMINOPHEN 650 MG RE SUPP: 650.0000 mg | Freq: Four times a day (QID) | RECTAL | Status: DC | PRN

## 2023-09-19 MED ORDER — INSULIN ASPART 100 UNIT/ML IJ SOLN
0.0000 [IU] | Freq: Three times a day (TID) | INTRAMUSCULAR | Status: DC
  Administered 2023-09-19: 3 [IU] via SUBCUTANEOUS
  Administered 2023-09-20: 11 [IU] via SUBCUTANEOUS
  Administered 2023-09-20: 15 [IU] via SUBCUTANEOUS
  Administered 2023-09-20: 5 [IU] via SUBCUTANEOUS
  Administered 2023-09-21: 11 [IU] via SUBCUTANEOUS
  Administered 2023-09-21: 3 [IU] via SUBCUTANEOUS

## 2023-09-19 MED ORDER — HYDROCHLOROTHIAZIDE 12.5 MG PO TABS
12.5000 mg | ORAL_TABLET | Freq: Every day | ORAL | Status: DC
  Administered 2023-09-20 – 2023-09-21 (×2): 12.5 mg via ORAL
  Filled 2023-09-19 (×2): qty 1

## 2023-09-19 MED ORDER — ACETAMINOPHEN 325 MG PO TABS
650.0000 mg | ORAL_TABLET | Freq: Four times a day (QID) | ORAL | Status: DC | PRN
  Administered 2023-09-19: 650 mg via ORAL
  Filled 2023-09-19: qty 2

## 2023-09-19 MED ORDER — LEVETIRACETAM 750 MG PO TABS
750.0000 mg | ORAL_TABLET | Freq: Two times a day (BID) | ORAL | Status: DC
  Administered 2023-09-20 – 2023-09-21 (×3): 750 mg via ORAL
  Filled 2023-09-19 (×4): qty 1

## 2023-09-19 MED ORDER — LEVETIRACETAM 500 MG PO TABS: 1000.0000 mg | ORAL_TABLET | Freq: Two times a day (BID) | ORAL | Status: DC

## 2023-09-19 MED ORDER — INSULIN ASPART 100 UNIT/ML IJ SOLN
0.0000 [IU] | Freq: Every day | INTRAMUSCULAR | Status: DC
  Administered 2023-09-19: 3 [IU] via SUBCUTANEOUS
  Administered 2023-09-20: 2 [IU] via SUBCUTANEOUS

## 2023-09-19 NOTE — Consult Note (Signed)
CARDIOLOGY CONSULT NOTE    Patient ID: Christopher Solomon; [id number]; 09/16/1981   Admit date: 09/19/2023 Date of Consult: 09/19/2023  Primary Care Provider: Roe Rutherford, NP Primary Cardiologist:  Primary Electrophysiologist:     History of Present Illness:   Mr. Christopher Solomon is a 42 year old M known to have HTN, DM 2, HLD presented to the [facility] ER with syncope. History is obtained from the wife.  Wife found him on the floor at their house and brought him to the ER.  She found his eyes glassy but otherwise did not find any froth from the mouth.  He took a long time to wake up and after waking up, he was confused.  He never had any seizure activity in the past.  Upon arrival to the [facility], ER, he was found to have right shoulder dislocation which was reduced and currently wearing a sling.  At [facility] today, before transporting him to [facility], he had tonic-clonic seizure activity for a few minutes and had to call rapid response.  Since then, he has been confused.  He has been somnolent during my interview.  Per wife, he is fairly active at home and denied having any symptoms.  Past Medical History:  Diagnosis Date   Diabetes mellitus without complication (HCC)    Headaches, cluster    tension   High cholesterol    Hypertension     Past Surgical History:  Procedure Laterality Date   no surgical history         Inpatient Medications: Scheduled Meds:  carvedilol  3.125 mg Oral BID WC   hydrochlorothiazide  12.5 mg Oral Daily   insulin aspart  0-15 Units Subcutaneous TID WC   insulin aspart  0-5 Units Subcutaneous QHS   insulin glargine-yfgn  40 Units Subcutaneous BID   irbesartan  300 mg Oral Daily   pantoprazole  40 mg Oral Daily   rosuvastatin  40 mg Oral Daily   Continuous Infusions:  levETIRAcetam 1,500 mg (09/19/23 1816)   PRN Meds: acetaminophen **OR** acetaminophen, ALPRAZolam, HYDROmorphone (DILAUDID) injection, LORazepam, ondansetron **OR**  ondansetron (ZOFRAN) IV, tiZANidine  Allergies:    Allergies  Allergen Reactions   Codeine Nausea And Vomiting   Metoclopramide Other (See Comments)    Makes him very agitated; can't stop moving.    Social History:   Social History   Socioeconomic History   Marital status: Married    Spouse name: Christopher Solomon   Number of children: 1   Years of education: 12th grade   Highest education level: Not on file  Occupational History   Occupation: Surveyor, mining    Comment: [employer]  Tobacco Use   Smoking status: Former    Current packs/day: 0.00    Types: Cigarettes    Quit date: 10/23/1999    Years since quitting: 23.9   Smokeless tobacco: Never  Substance and Sexual Activity   Alcohol use: No    Alcohol/week: 0.0 standard drinks of alcohol   Drug use: No   Sexual activity: Not on file  Other Topics Concern   Not on file  Social History Narrative   Lives with his wife and son.   Caffeine: 2 soft drinks/ day   Occasional coffee use   Social Determinants of Health   Financial Resource Strain: Low Risk  (03/15/2023)   Received from [facility], [facility]   Overall Financial Resource Strain (CARDIA)    Difficulty of Paying Living Expenses: Not hard at all  Food Insecurity: No Food Insecurity (09/19/2023)   Hunger Vital Sign    Worried About Running Out of Food in the Last Year: Never true    Ran Out of Food in the Last Year: Never true  Transportation Needs: No Transportation Needs (09/19/2023)   PRAPARE - Administrator, Civil Service (Medical): No    Lack of Transportation (Non-Medical): No  Physical Activity: Unknown (11/20/2021)   Received from [facility], [facility]   Exercise Vital Sign    Days of Exercise per Week: 4 days    Minutes of Exercise per Session: Not on file  Stress: Unknown (11/20/2021)   Received from [facility], [facility] Occup[facility] - Occupational Stress Questionnaire     Feeling of Stress : Patient declined  Social Connections: Unknown (02/20/2022)   Received from [facility], [facility]   Social Network    Social Network: Not on file  Intimate Partner Violence: Not At Risk (09/19/2023)   Humiliation, Afraid, Rape, and Kick questionnaire    Fear of Current or Ex-Partner: No    Emotionally Abused: No    Physically Abused: No    Sexually Abused: No    Family History:    Family History  Problem Relation Age of Onset   Diabetes Paternal Grandfather    Cancer Paternal Grandmother    Lung cancer Maternal Grandmother      ROS:  Please see the history of present illness.  ROS  All other ROS reviewed and negative.     Physical Exam/Data:   Vitals:   09/19/23 1803 09/19/23 1806 09/19/23 1810 09/19/23 1813  BP: (!) 71/27 (!) 162/86 (!) 80/38 118/68  Pulse: 93 93 92 89  Resp:      Temp:      TempSrc:      SpO2: (!) 72% (!) 89% 92% 95%  Weight:      Height:       No intake or output data in the 24 hours ending 09/19/23 1824 Filed Weights   09/19/23 1442 09/19/23 1451 09/19/23 1719  Weight: (!) 145.1 kg (!) 145.1 kg (!) 145.2 kg   Body mass index is 45.93 kg/m.  General:  Well nourished, well developed, in no acute distress HEENT: normal Lymph: no adenopathy Neck: no JVD Endocrine:  No thryomegaly Vascular: No carotid bruits; FA pulses 2+ bilaterally without bruits  Cardiac:  normal S1, S2; RRR; no murmur  Lungs:  clear to auscultation bilaterally, no wheezing, rhonchi or rales  Abd: soft, nontender, no hepatomegaly  Ext: no edema Musculoskeletal:  No deformities, BUE and BLE strength normal and equal Skin: warm and dry  Neuro: Patient somnolent Psych:  Normal affect   EKG:  The EKG was personally reviewed and demonstrates:  NSR   Laboratory Data:  Chemistry Recent Labs  Lab 09/19/23 1135  NA 134*  K 4.5  CL 98  CO2 24  GLUCOSE 282*  BUN 15  CREATININE 0.76  CALCIUM 9.5  GFRNONAA >60  ANIONGAP 12    Recent  Labs  Lab 09/19/23 1135  PROT 7.6  ALBUMIN 4.1  AST 27  ALT 31  ALKPHOS 69  BILITOT 0.8   Hematology Recent Labs  Lab 09/19/23 1135  WBC 9.6  RBC 5.11  HGB 16.4  HCT 47.4  MCV 92.8  MCH 32.1  MCHC 34.6  RDW 12.1  PLT 138*   Cardiac EnzymesNo results for input(s): "TROPONINI" in the last 168 hours. No results  for input(s): "TROPIPOC" in the last 168 hours.  BNPNo results for input(s): "BNP", "PROBNP" in the last 168 hours.  DDimer No results for input(s): "DDIMER" in the last 168 hours.  Radiology/Studies:  DG Shoulder Right  Result Date: 09/19/2023 CLINICAL DATA:  Status post reduction of right glenohumeral anterior dislocation EXAM: RIGHT SHOULDER - 2+ VIEW COMPARISON:  09/19/2023 at 10:39 a.m. FINDINGS: Internally rotated and trans-scapular views of the right shoulder are satisfactory to demonstrate successful reduction of the previous anterior dislocation. The transscapular view has a vertically exaggerated axis but accounting for this the humerus aligns with the glenoid. Subacromial morphology is type 2 (curved). No large or obvious Hill-Sachs impaction or bony Bankart fracture seen. IMPRESSION: 1. Successful reduction of the previous anterior dislocation of the right shoulder. Electronically Signed   By: Gaylyn Rong M.D.   On: 09/19/2023 14:14   CT Head Wo Contrast  Result Date: 09/19/2023 CLINICAL DATA:  Head trauma, moderate-severe; Polytrauma, blunt EXAM: CT HEAD WITHOUT CONTRAST CT CERVICAL SPINE WITHOUT CONTRAST TECHNIQUE: Multidetector CT imaging of the head and cervical spine was performed following the standard protocol without intravenous contrast. Multiplanar CT image reconstructions of the cervical spine were also generated. RADIATION DOSE REDUCTION: This exam was performed according to the departmental dose-optimization program which includes automated exposure control, adjustment of the mA and/or kV according to patient size and/or use of iterative  reconstruction technique. COMPARISON:  None Available. FINDINGS: CT HEAD FINDINGS Brain: No hemorrhage. No hydrocephalus. No extra-axial fluid collection. No CT evidence of an acute cortical infarct. No mass effect. No mass lesion. Vascular: No hyperdense vessel or unexpected calcification. Skull: Normal. Negative for fracture or focal lesion. Sinuses/Orbits: No middle ear or mastoid effusion. Paranasal sinuses are clear. Orbits are unremarkable. Other: None. CT CERVICAL SPINE FINDINGS Alignment: Straightening of the normal cervical lordosis. Skull base and vertebrae: No acute fracture. No primary bone lesion or focal pathologic process. Soft tissues and spinal canal: No prevertebral fluid or swelling. No visible canal hematoma. Disc levels:  No CT evidence of high-grade spinal canal stenosis. Upper chest: Negative. Other: None IMPRESSION: 1. No CT evidence of intracranial injury. 2. No acute fracture or traumatic subluxation of the cervical spine. Electronically Signed   By: Lorenza Cambridge M.D.   On: 09/19/2023 12:35   CT Cervical Spine Wo Contrast  Result Date: 09/19/2023 CLINICAL DATA:  Head trauma, moderate-severe; Polytrauma, blunt EXAM: CT HEAD WITHOUT CONTRAST CT CERVICAL SPINE WITHOUT CONTRAST TECHNIQUE: Multidetector CT imaging of the head and cervical spine was performed following the standard protocol without intravenous contrast. Multiplanar CT image reconstructions of the cervical spine were also generated. RADIATION DOSE REDUCTION: This exam was performed according to the departmental dose-optimization program which includes automated exposure control, adjustment of the mA and/or kV according to patient size and/or use of iterative reconstruction technique. COMPARISON:  None Available. FINDINGS: CT HEAD FINDINGS Brain: No hemorrhage. No hydrocephalus. No extra-axial fluid collection. No CT evidence of an acute cortical infarct. No mass effect. No mass lesion. Vascular: No hyperdense vessel or  unexpected calcification. Skull: Normal. Negative for fracture or focal lesion. Sinuses/Orbits: No middle ear or mastoid effusion. Paranasal sinuses are clear. Orbits are unremarkable. Other: None. CT CERVICAL SPINE FINDINGS Alignment: Straightening of the normal cervical lordosis. Skull base and vertebrae: No acute fracture. No primary bone lesion or focal pathologic process. Soft tissues and spinal canal: No prevertebral fluid or swelling. No visible canal hematoma. Disc levels:  No CT evidence of high-grade spinal canal stenosis. Upper  chest: Negative. Other: None IMPRESSION: 1. No CT evidence of intracranial injury. 2. No acute fracture or traumatic subluxation of the cervical spine. Electronically Signed   By: Lorenza Cambridge M.D.   On: 09/19/2023 12:35   DG Chest Port 1 View  Result Date: 09/19/2023 CLINICAL DATA:  Fall, syncope EXAM: PORTABLE CHEST 1 VIEW COMPARISON:  None Available. FINDINGS: The heart size and mediastinal contours are within normal limits. Both lungs are clear. Right glenohumeral joint is anteriorly dislocated. IMPRESSION: 1. No acute findings within the chest. 2. Right glenohumeral joint is dislocated. Electronically Signed   By: Duanne Guess D.O.   On: 09/19/2023 12:33   DG Shoulder Right  Result Date: 09/19/2023 CLINICAL DATA:  Found lying on the floor in the bathroom. Shoulder symptoms. EXAM: RIGHT SHOULDER - 2+ VIEW COMPARISON:  None Available. FINDINGS: Anterior dislocation of the humeral head, abutting the anterior margin of the glenoid. No convincing fracture.  No bone lesion. AC joint normally spaced and aligned. IMPRESSION: Anterior dislocation of the humeral head. No fracture identified. Electronically Signed   By: Amie Portland M.D.   On: 09/19/2023 10:53    Assessment and Plan:   Syncope likely secondary to seizure Troponin elevation secondary to demand ischemia from seizure activity Moderate CAD with negative FFR HTN, DM 2, HLD   -Presented with syncope  (was found down on the floor by his wife) and had postictal confusion. Troponins are checked for unclear reasons, 10>> 147>> 294. Denied having chest pain ever per wife.  Troponin elevation likely from demand ischemia due to seizure activity.  No indication of ACS protocol. 2D echocardiogram is pending.  Strongly encouraged neurology consultation. -Continue current antihypertensives, Coreg 3.125 mg twice daily, irbesartan 300 mg once daily.  Continue rosuvastatin 40 mg nightly.   For questions or updates, please contact [facility] Please consult [email or web address] for contact info under Cardiology/STEMI.   Signed, Herbert Deaner, MD 09/19/2023 6:24 PM

## 2023-09-19 NOTE — ED Provider Notes (Addendum)
[place] [facility]ERGENCY DEPARTMENT AT [facility] Provider Note   CSN: [id number] Arrival date & time: 09/19/23  6433     History  Chief Complaint  Patient presents with   Loss of Consciousness    Christopher Solomon is a 42 y.o. male.  Patient with a syncopal episode at about 815.  Wife had heard him fall in the bathroom was not sure what that was because she was asleep but within 5 minutes she can heard him snoring so she went into check on him he was on the floor in the bathroom.  Patient stated he kind of felt dizzy maybe a few light flashes just prior to passing out.  Felt fine yesterday.  Never passed out before.  When he awoke he had a lot of pain in his right shoulder.  Patient's wife feels that he was out for at least 5 minutes.  No witnessed seizure activity.  But he had a glazed on his face when she got to him.  His blood sugar was 280 at home.  Temp here 98 heart rate 101 respirations 23 blood pressure 121/70.  Oxygen saturation 95% on room air.  Patient has no complaints of any upper extremity or lower extremity discomfort other than the right upper extremity.  Past medical history significant for diabetes hypertension high cholesterol and cluster headaches.  Patient does not have any speech problems any vision problems currently no weakness or numbness to the extremities.  Does have pain in the right upper extremity at the shoulder.  Patient is a former smoker quit in 2001.  Patient is never passed out before.  Patient denies any chest pain.       Home Medications Prior to Admission medications   Medication Sig Start Date End Date Taking? Authorizing Provider  ALPRAZolam Prudy Feeler) 0.5 MG tablet Take 0.5 mg by mouth at bedtime as needed for anxiety.    [provider]  carvedilol (COREG) 3.125 MG tablet Take 3.125 mg by mouth 2 (two) times daily with a meal.    [provider]  Continuous Blood Gluc Transmit (DEXCOM G6 TRANSMITTER) MISC USE TO check sugars  6-8 TIMES DAILY (90 DAY supply) 10/31/21   [provider]  fluticasone (FLONASE) 50 MCG/ACT nasal spray Place 2 sprays into both nostrils as needed. 12/23/14   [provider]  hydrochlorothiazide (MICROZIDE) 12.5 MG capsule Take 12.5 mg by mouth daily.    [provider]  insulin degludec (TRESIBA) 200 UNIT/ML FlexTouch Pen 40 Units. Morning and Night 06/05/21   [provider]  insulin lispro (HUMALOG) 100 UNIT/ML KwikPen INJECT 5-15 UNITS INTO THE SKIN FOUR TIMES DAILY PER sliding scale 08/14/21   [provider]  metFORMIN (GLUCOPHAGE-XR) 500 MG 24 hr tablet Take by mouth. Patient not taking: Reported on 02/26/2023 11/20/21   [provider]  metoprolol tartrate (LOPRESSOR) 100 MG tablet Take 1 tablet (100 mg total) by mouth once for 1 dose. Take 2 hours prior to procedure. 02/09/22 02/09/22  Runell Gess, MD  MOUNJARO 2.5 MG/0.5ML Pen Inject 10 mg into the skin once a week. 11/22/21   [provider]  omeprazole (PRILOSEC) 40 MG capsule Take 40 mg by mouth daily.    [provider]  ondansetron (ZOFRAN-ODT) 4 MG disintegrating tablet ALLOW 1 TABLET TO DISSOLVE UNDER THE TONGUE EVERY 8 HOURS AS NEEDED FOR NAUSEA OR VOMITING 01/31/16   Anson Fret, MD  rosuvastatin (CRESTOR) 40 MG tablet Take 1 tablet (40 mg  total) by mouth daily. 03/25/23   Runell Gess, MD  tirzepatide (ZEPBOUND) 12.5 MG/0.5ML Pen Inject 12.5 mg into the skin once a week. 09/04/23     tiZANidine (ZANAFLEX) 4 MG tablet Take 4 mg by mouth every 6 (six) hours as needed for muscle spasms.    [provider]  triamcinolone cream (KENALOG) 0.1 % Apply to affected areas 2 times daily. 05/31/21     valsartan (DIOVAN) 320 MG tablet Take 320 mg by mouth daily. 01/25/22   [provider]      Allergies    Codeine and Metoclopramide    Review of Systems   Review of Systems  Constitutional:  Negative for chills and fever.  HENT:  Negative for ear  pain and sore throat.   Eyes:  Negative for pain and visual disturbance.  Respiratory:  Negative for cough and shortness of breath.   Cardiovascular:  Negative for chest pain and palpitations.  Gastrointestinal:  Negative for abdominal pain and vomiting.  Genitourinary:  Negative for dysuria and hematuria.  Musculoskeletal:  Negative for arthralgias and back pain.  Skin:  Negative for color change and rash.  Neurological:  Positive for dizziness and syncope. Negative for seizures.  All other systems reviewed and are negative.   Physical Exam Updated Vital Signs BP 129/81   Pulse 83   Temp 98 F (36.7 C) (Oral)   Resp 11   Ht 1.778 m (5\' 10" )   Wt (!) 142.9 kg   SpO2 95%   BMI 45.20 kg/m  Physical Exam Vitals and nursing note reviewed.  Constitutional:      General: He is not in acute distress.    Appearance: Normal appearance. He is well-developed.  HENT:     Head: Normocephalic.     Comments: Some swelling and bruising to the right side of the forehead.    Mouth/Throat:     Mouth: Mucous membranes are moist.  Eyes:     Extraocular Movements: Extraocular movements intact.     Conjunctiva/sclera: Conjunctivae normal.     Pupils: Pupils are equal, round, and reactive to light.  Cardiovascular:     Rate and Rhythm: Normal rate and regular rhythm.     Heart sounds: No murmur heard. Pulmonary:     Effort: Pulmonary effort is normal. No respiratory distress.     Breath sounds: Normal breath sounds.  Abdominal:     Palpations: Abdomen is soft.     Tenderness: There is no abdominal tenderness.  Musculoskeletal:        General: Deformity present. No swelling.     Cervical back: Normal range of motion and neck supple.     Right lower leg: No edema.     Left lower leg: No edema.     Comments: Obvious deformity to right shoulder consistent with dislocation.  Radial pulse 2+ sensation intact to fingers.  Good movement of fingers.  Skin:    General: Skin is warm and dry.      Capillary Refill: Capillary refill takes less than 2 seconds.  Neurological:     General: No focal deficit present.     Mental Status: He is alert and oriented to person, place, and time.     Cranial Nerves: No cranial nerve deficit.     Sensory: No sensory deficit.     Motor: No weakness.  Psychiatric:        Mood and Affect: Mood normal.     ED Results / Procedures / Treatments  Labs (all labs ordered are listed, but only abnormal results are displayed) Labs Reviewed  CBG MONITORING, ED - Abnormal; Notable for the following components:      Result Value   Glucose-Capillary 296 (*)    All other components within normal limits  CBC WITH DIFFERENTIAL/PLATELET  COMPREHENSIVE METABOLIC PANEL    EKG EKG Interpretation Date/Time:  Thursday September 19 2023 09:02:38 EST Ventricular Rate:  85 PR Interval:  181 QRS Duration:  99 QT Interval:  366 QTC Calculation: 436 R Axis:   32  Text Interpretation: Sinus rhythm Low voltage, precordial leads ST elevation suggests acute pericarditis No significant change since last tracing Confirmed by Vanetta Mulders [phone] on 09/19/2023 10:42:22 AM  Radiology DG Shoulder Right  Result Date: 09/19/2023 CLINICAL DATA:  Found lying on the floor in the bathroom. Shoulder symptoms. EXAM: RIGHT SHOULDER - 2+ VIEW COMPARISON:  None Available. FINDINGS: Anterior dislocation of the humeral head, abutting the anterior margin of the glenoid. No convincing fracture.  No bone lesion. AC joint normally spaced and aligned. IMPRESSION: Anterior dislocation of the humeral head. No fracture identified. Electronically Signed   By: Amie Portland M.D.   On: 09/19/2023 10:53    Procedures .Sedation  Date/Time: 09/19/2023 1:59 PM  Performed by: Vanetta Mulders, MD Authorized by: Vanetta Mulders, MD   Consent:    Consent obtained:  Written   Consent given by:  Patient   Risks discussed:  Allergic reaction, prolonged hypoxia resulting in organ damage,  dysrhythmia, prolonged sedation necessitating reversal, respiratory compromise necessitating ventilatory assistance and intubation, inadequate sedation, nausea and vomiting   Alternatives discussed:  Analgesia without sedation Universal protocol:    Procedure explained and questions answered to patient or proxy's satisfaction: yes     Relevant documents present and verified: yes     Test results available: yes     Imaging studies available: yes     Site/side marked: no     Immediately prior to procedure, a time out was called: yes   Indications:    Procedure performed:  Dislocation reduction Pre-sedation assessment:    Time since last food or drink:  12   ASA classification: class 2 - patient with mild systemic disease     Mouth opening:  3 or more finger widths   Mallampati score:  II - soft palate, uvula, fauces visible   Neck mobility: normal     Pre-sedation assessments completed and reviewed: airway patency, cardiovascular function, mental status and respiratory function     Pre-sedation assessments completed and reviewed: pre-procedure nausea and vomiting status not reviewed   A pre-sedation assessment was completed prior to the start of the procedure Immediate pre-procedure details:    Reviewed: vital signs, relevant labs/tests and NPO status     Verified: emergency equipment available, intubation equipment available, IV patency confirmed and oxygen available   Procedure details (see MAR for exact dosages):    Preoxygenation:  Nasal cannula   Sedation:  Etomidate   Intended level of sedation: deep   Analgesia:  Hydromorphone   Intra-procedure monitoring:  Blood pressure monitoring, continuous capnometry, frequent LOC assessments, cardiac monitor and continuous pulse oximetry   Intra-procedure events: none     Intra-procedure management:  Supplemental oxygen   Total Provider sedation time (minutes):  15 Post-procedure details:   A post-sedation assessment was completed  following the completion of the procedure.   Post-sedation assessments completed and reviewed: airway patency, cardiovascular function, mental status, nausea/vomiting and respiratory function  Patient is stable for discharge or admission: yes     Procedure completion:  Tolerated well, no immediate complications     Medications Ordered in ED Medications  0.9 %  sodium chloride infusion (has no administration in time range)  ondansetron (ZOFRAN) injection 4 mg (has no administration in time range)  HYDROmorphone (DILAUDID) injection 1 mg (has no administration in time range)  etomidate (AMIDATE) injection 10 mg (has no administration in time range)    ED Course/ Medical Decision Making/ A&P                                 Medical Decision Making Amount and/or Complexity of Data Reviewed Labs: ordered. Radiology: ordered.  Risk Prescription drug management. Decision regarding hospitalization.  X-ray of the right shoulder consistent with an anterior dislocation.  Patient is going need CT head will go and do neck.  Because of the syncopal episode does have some swelling to the forehead area.  Does not have a good explanation for the syncopal event.  He was out for a period of time.  Possibly could have been seizure but no history of seizures.  Will probably need admission for the syncopal episode.  Once we have a negative head CT and all we will go ahead and relocate his shoulder.  EKG does show some diffuse ST segment elevation.  But not significantly different from previous.  But we will get a troponin and will get magnesium for the possibility of seizure.  Patient currently neurologically intact.  CT head CT cervical spine negative.  Chest x-ray without any acute findings.  Patient's initial troponin was 10 magnesium 1.7.  CBC no leukocytosis hemoglobin 16.4 platelets 138 complete metabolic panel other than glucose of 282 CO2 is 24 sodium down a little bit a little bit of  pseudohyponatremia.  And patient's GFR greater than 60 liver function test normal anion gap normal.  Patient received etomidate 10 mg for conscious sedation had physician assistant assist I provided traction she did the reduction see her note.  Clinically patient's shoulder seems to be reduced.  He tolerated the etomidate well no providing complications.  Shoulder immobilizer placed.  However patient had unknown cause of syncope not clear maybe whether there was seizure because he was out maybe for 5 minutes.  Will discuss with hospitalist for admission.  Patient needs to keep the sling in place and he can follow-up with Dr. Romeo Apple in [place] post discharge.  No need for orthopedics to see him now.  Post reduction of the shoulder x-rays show that it is reduced.  Patient also completely back to normal.  After the sedation and also neuroexam completely normal.  Discussed with hospitalist who will admit.    Final Clinical Impression(s) / ED Diagnoses Final diagnoses:  Dislocation of right shoulder joint, initial encounter  Syncope and collapse    Rx / DC Orders ED Discharge Orders     None         Vanetta Mulders, MD 09/19/23 1137    Vanetta Mulders, MD 09/19/23 1357    Vanetta Mulders, MD 09/19/23 1404    Vanetta Mulders, MD 09/19/23 1420

## 2023-09-19 NOTE — ED Provider Notes (Signed)
I was asked by attending physician to assist with right shoulder reduction.  This was my only involvement in patient's care.   .Reduction of dislocation  Date/Time: 09/19/2023 1:47 PM  Performed by: Pauline Aus, PA-C Authorized by: Pauline Aus, PA-C  Consent: Verbal consent obtained. Risks and benefits: risks, benefits and alternatives were discussed Consent given by: patient Patient understanding: patient states understanding of the procedure being performed Relevant documents: relevant documents present and verified Test results: test results available and properly labeled Imaging studies: imaging studies available Patient identity confirmed: verbally with patient, arm band and provided demographic data Time out: Immediately prior to procedure a "time out" was called to verify the correct patient, procedure, equipment, support staff and site/side marked as required. Local anesthesia used: no  Anesthesia: Local anesthesia used: no Patient tolerance: patient tolerated the procedure well with no immediate complications Comments: Successful reduction of right shoulder, confirmed with postreduction x-ray  Patient was sedated for procedure, please see Dr. Darlyn Chamber note for procedural sedation        Pauline Aus, PA-C 09/19/23 1419    Vanetta Mulders, MD 09/19/23 [phone]

## 2023-09-19 NOTE — H&P (Signed)
History and Physical    Christopher Solomon BJY:[id number] DOB: Mar 09, 1981 DOA: 09/19/2023  PCP: Roe Rutherford, NP   Patient coming from: Home  Chief Complaint: Fall with syncope  HPI: Christopher Solomon is a 42 y.o. male with medical history significant for hypertension, type 2 diabetes, dyslipidemia, migraine headaches, OSA on CPAP, and morbid obesity who presented to the ED after experiencing a fall at home with noted headache and shoulder pain.  This occurred at about 815 this morning.  His wife heard him fall in the bathroom, but was unsure of the noise as she was asleep, but within 5 minutes he was heard snoring and so she went to check on him and found him on the floor of the bathroom.  He was noted to have a red mark on his head where he had hit his head and complained of right sided shoulder pain.  Patient states that he felt kind of dizzy and had a few light flashes just prior to passing out.  He has never had a syncopal episode like this previously.  No seizure activity was witnessed and patient has not had an episode of passing out like this previously.  He denies any speech deficits or visual changes or any focal deficits of weakness or numbness to his extremities.  He denies any palpitations or chest pain.  He did have some nausea and diaphoresis prior to arrival.  His blood sugar was 280 at home.   ED Course: Vital signs with some elevated blood pressure readings, otherwise stable.  Glucose 282.  Platelets are 138 and sodium is 134.  CT of the head and neck with no acute findings.  He was noted to have a right anterior shoulder dislocation on x-ray which was reduced in the ED. troponins have now resulted and are slightly elevated.  Discussed case with cardiology at [facility] who agrees that patient will require transfer.  Review of Systems: Reviewed as noted above, otherwise negative.  Past Medical History:  Diagnosis Date   Diabetes mellitus without complication (HCC)    Headaches,  cluster    tension   High cholesterol    Hypertension     Past Surgical History:  Procedure Laterality Date   no surgical history       reports that he quit smoking about 23 years ago. His smoking use included cigarettes. He has never used smokeless tobacco. He reports that he does not drink alcohol and does not use drugs.  Allergies  Allergen Reactions   Codeine Nausea And Vomiting   Metoclopramide Other (See Comments)    Makes him very agitated; can't stop moving. Other reaction(s): Other Makes him very agitated; can't stop moving.    Family History  Problem Relation Age of Onset   Diabetes Paternal Grandfather    Cancer Paternal Grandmother    Lung cancer Maternal Grandmother     Prior to Admission medications   Medication Sig Start Date End Date Taking? Authorizing Provider  ALPRAZolam Prudy Feeler) 0.5 MG tablet Take 0.5 mg by mouth at bedtime as needed for anxiety.    [provider]  carvedilol (COREG) 3.125 MG tablet Take 3.125 mg by mouth 2 (two) times daily with a meal.    [provider]  Continuous Blood Gluc Transmit (DEXCOM G6 TRANSMITTER) MISC USE TO check sugars 6-8 TIMES DAILY (90 DAY supply) 10/31/21   [provider]  fluticasone (FLONASE) 50 MCG/ACT nasal spray Place 2 sprays into both nostrils as needed. 12/23/14  [provider]  hydrochlorothiazide (MICROZIDE) 12.5 MG capsule Take 12.5 mg by mouth daily.    [provider]  insulin degludec (TRESIBA) 200 UNIT/ML FlexTouch Pen 40 Units. Morning and Night 06/05/21   [provider]  insulin lispro (HUMALOG) 100 UNIT/ML KwikPen INJECT 5-15 UNITS INTO THE SKIN FOUR TIMES DAILY PER sliding scale 08/14/21   [provider]  metFORMIN (GLUCOPHAGE-XR) 500 MG 24 hr tablet Take by mouth. Patient not taking: Reported on 02/26/2023 11/20/21   [provider]  metoprolol tartrate (LOPRESSOR) 100 MG tablet Take 1 tablet (100 mg total) by mouth once for 1  dose. Take 2 hours prior to procedure. 02/09/22 02/09/22  Runell Gess, MD  MOUNJARO 2.5 MG/0.5ML Pen Inject 10 mg into the skin once a week. 11/22/21   [provider]  omeprazole (PRILOSEC) 40 MG capsule Take 40 mg by mouth daily.    [provider]  ondansetron (ZOFRAN-ODT) 4 MG disintegrating tablet ALLOW 1 TABLET TO DISSOLVE UNDER THE TONGUE EVERY 8 HOURS AS NEEDED FOR NAUSEA OR VOMITING 01/31/16   Anson Fret, MD  rosuvastatin (CRESTOR) 40 MG tablet Take 1 tablet (40 mg total) by mouth daily. 03/25/23   Runell Gess, MD  tirzepatide (ZEPBOUND) 12.5 MG/0.5ML Pen Inject 12.5 mg into the skin once a week. 09/04/23     tiZANidine (ZANAFLEX) 4 MG tablet Take 4 mg by mouth every 6 (six) hours as needed for muscle spasms.    [provider]  triamcinolone cream (KENALOG) 0.1 % Apply to affected areas 2 times daily. 05/31/21     valsartan (DIOVAN) 320 MG tablet Take 320 mg by mouth daily. 01/25/22   [provider]    Physical Exam: Vitals:   09/19/23 1342 09/19/23 1345 09/19/23 1346 09/19/23 1400  BP: 135/80 (!) 143/102 (!) 143/102 126/67  Pulse: 92 92 97 88  Resp: 16 15 18 18   Temp: 98.9 F (37.2 C) 99.1 F (37.3 C)    TempSrc: Axillary Oral    SpO2: 92% 93% 96% 92%  Weight:      Height:        Constitutional: NAD, calm, comfortable, morbidly obese Vitals:   09/19/23 1342 09/19/23 1345 09/19/23 1346 09/19/23 1400  BP: 135/80 (!) 143/102 (!) 143/102 126/67  Pulse: 92 92 97 88  Resp: 16 15 18 18   Temp: 98.9 F (37.2 C) 99.1 F (37.3 C)    TempSrc: Axillary Oral    SpO2: 92% 93% 96% 92%  Weight:      Height:       Eyes: lids and conjunctivae normal Neck: normal, supple Respiratory: clear to auscultation bilaterally. Normal respiratory effort. No accessory muscle use.  Cardiovascular: Regular rate and rhythm, no murmurs. Abdomen: no tenderness, no distention. Bowel sounds positive.  Musculoskeletal:  No edema.  Right shoulder/upper  extremity in sling Skin: no rashes, lesions, ulcers.  Psychiatric: Flat affect  Labs on Admission: I have personally reviewed following labs and imaging studies  CBC: Recent Labs  Lab 09/19/23 1135  WBC 9.6  NEUTROABS 8.1*  HGB 16.4  HCT 47.4  MCV 92.8  PLT 138*   Basic Metabolic Panel: Recent Labs  Lab 09/19/23 1135 09/19/23 1143  NA 134*  --   K 4.5  --   CL 98  --   CO2 24  --   GLUCOSE 282*  --   BUN 15  --   CREATININE 0.76  --   CALCIUM 9.5  --  MG  --  1.7   GFR: Estimated Creatinine Clearance: 168.3 mL/min (by C-G formula based on SCr of 0.76 mg/dL). Liver Function Tests: Recent Labs  Lab 09/19/23 1135  AST 27  ALT 31  ALKPHOS 69  BILITOT 0.8  PROT 7.6  ALBUMIN 4.1   No results for input(s): "LIPASE", "AMYLASE" in the last 168 hours. No results for input(s): "AMMONIA" in the last 168 hours. Coagulation Profile: No results for input(s): "INR", "PROTIME" in the last 168 hours. Cardiac Enzymes: No results for input(s): "CKTOTAL", "CKMB", "CKMBINDEX", "TROPONINI" in the last 168 hours. BNP (last 3 results) No results for input(s): "PROBNP" in the last 8760 hours. HbA1C: No results for input(s): "HGBA1C" in the last 72 hours. CBG: Recent Labs  Lab 09/19/23 0900  GLUCAP 296*   Lipid Profile: No results for input(s): "CHOL", "HDL", "LDLCALC", "TRIG", "CHOLHDL", "LDLDIRECT" in the last 72 hours. Thyroid Function Tests: No results for input(s): "TSH", "T4TOTAL", "FREET4", "T3FREE", "THYROIDAB" in the last 72 hours. Anemia Panel: No results for input(s): "VITAMINB12", "FOLATE", "FERRITIN", "TIBC", "IRON", "RETICCTPCT" in the last 72 hours. Urine analysis:    Component Value Date/Time   COLORURINE YELLOW 05/20/2023 1320   APPEARANCEUR CLEAR 05/20/2023 1320   LABSPEC 1.017 05/20/2023 1320   PHURINE 5.5 05/20/2023 1320   GLUCOSEU NEGATIVE 05/20/2023 1320   HGBUR TRACE (A) 05/20/2023 1320   BILIRUBINUR NEGATIVE 05/20/2023 1320   KETONESUR  NEGATIVE 05/20/2023 1320   PROTEINUR TRACE (A) 05/20/2023 1320   NITRITE NEGATIVE 05/20/2023 1320   LEUKOCYTESUR MODERATE (A) 05/20/2023 1320    Radiological Exams on Admission: DG Shoulder Right  Result Date: 09/19/2023 CLINICAL DATA:  Status post reduction of right glenohumeral anterior dislocation EXAM: RIGHT SHOULDER - 2+ VIEW COMPARISON:  09/19/2023 at 10:39 a.m. FINDINGS: Internally rotated and trans-scapular views of the right shoulder are satisfactory to demonstrate successful reduction of the previous anterior dislocation. The transscapular view has a vertically exaggerated axis but accounting for this the humerus aligns with the glenoid. Subacromial morphology is type 2 (curved). No large or obvious Hill-Sachs impaction or bony Bankart fracture seen. IMPRESSION: 1. Successful reduction of the previous anterior dislocation of the right shoulder. Electronically Signed   By: Gaylyn Rong M.D.   On: 09/19/2023 14:14   CT Head Wo Contrast  Result Date: 09/19/2023 CLINICAL DATA:  Head trauma, moderate-severe; Polytrauma, blunt EXAM: CT HEAD WITHOUT CONTRAST CT CERVICAL SPINE WITHOUT CONTRAST TECHNIQUE: Multidetector CT imaging of the head and cervical spine was performed following the standard protocol without intravenous contrast. Multiplanar CT image reconstructions of the cervical spine were also generated. RADIATION DOSE REDUCTION: This exam was performed according to the departmental dose-optimization program which includes automated exposure control, adjustment of the mA and/or kV according to patient size and/or use of iterative reconstruction technique. COMPARISON:  None Available. FINDINGS: CT HEAD FINDINGS Brain: No hemorrhage. No hydrocephalus. No extra-axial fluid collection. No CT evidence of an acute cortical infarct. No mass effect. No mass lesion. Vascular: No hyperdense vessel or unexpected calcification. Skull: Normal. Negative for fracture or focal lesion. Sinuses/Orbits:  No middle ear or mastoid effusion. Paranasal sinuses are clear. Orbits are unremarkable. Other: None. CT CERVICAL SPINE FINDINGS Alignment: Straightening of the normal cervical lordosis. Skull base and vertebrae: No acute fracture. No primary bone lesion or focal pathologic process. Soft tissues and spinal canal: No prevertebral fluid or swelling. No visible canal hematoma. Disc levels:  No CT evidence of high-grade spinal canal stenosis. Upper chest: Negative. Other: None IMPRESSION: 1. No  CT evidence of intracranial injury. 2. No acute fracture or traumatic subluxation of the cervical spine. Electronically Signed   By: Lorenza Cambridge M.D.   On: 09/19/2023 12:35   CT Cervical Spine Wo Contrast  Result Date: 09/19/2023 CLINICAL DATA:  Head trauma, moderate-severe; Polytrauma, blunt EXAM: CT HEAD WITHOUT CONTRAST CT CERVICAL SPINE WITHOUT CONTRAST TECHNIQUE: Multidetector CT imaging of the head and cervical spine was performed following the standard protocol without intravenous contrast. Multiplanar CT image reconstructions of the cervical spine were also generated. RADIATION DOSE REDUCTION: This exam was performed according to the departmental dose-optimization program which includes automated exposure control, adjustment of the mA and/or kV according to patient size and/or use of iterative reconstruction technique. COMPARISON:  None Available. FINDINGS: CT HEAD FINDINGS Brain: No hemorrhage. No hydrocephalus. No extra-axial fluid collection. No CT evidence of an acute cortical infarct. No mass effect. No mass lesion. Vascular: No hyperdense vessel or unexpected calcification. Skull: Normal. Negative for fracture or focal lesion. Sinuses/Orbits: No middle ear or mastoid effusion. Paranasal sinuses are clear. Orbits are unremarkable. Other: None. CT CERVICAL SPINE FINDINGS Alignment: Straightening of the normal cervical lordosis. Skull base and vertebrae: No acute fracture. No primary bone lesion or focal  pathologic process. Soft tissues and spinal canal: No prevertebral fluid or swelling. No visible canal hematoma. Disc levels:  No CT evidence of high-grade spinal canal stenosis. Upper chest: Negative. Other: None IMPRESSION: 1. No CT evidence of intracranial injury. 2. No acute fracture or traumatic subluxation of the cervical spine. Electronically Signed   By: Lorenza Cambridge M.D.   On: 09/19/2023 12:35   DG Chest Port 1 View  Result Date: 09/19/2023 CLINICAL DATA:  Fall, syncope EXAM: PORTABLE CHEST 1 VIEW COMPARISON:  None Available. FINDINGS: The heart size and mediastinal contours are within normal limits. Both lungs are clear. Right glenohumeral joint is anteriorly dislocated. IMPRESSION: 1. No acute findings within the chest. 2. Right glenohumeral joint is dislocated. Electronically Signed   By: Duanne Guess D.O.   On: 09/19/2023 12:33   DG Shoulder Right  Result Date: 09/19/2023 CLINICAL DATA:  Found lying on the floor in the bathroom. Shoulder symptoms. EXAM: RIGHT SHOULDER - 2+ VIEW COMPARISON:  None Available. FINDINGS: Anterior dislocation of the humeral head, abutting the anterior margin of the glenoid. No convincing fracture.  No bone lesion. AC joint normally spaced and aligned. IMPRESSION: Anterior dislocation of the humeral head. No fracture identified. Electronically Signed   By: Amie Portland M.D.   On: 09/19/2023 10:53    EKG: Independently reviewed. SR 85bpm.  Assessment/Plan Principal Problem:   Syncope Active Problems:   Diabetes type 2, controlled (HCC)   HTN (hypertension)   Hyperlipidemia   BMI 45.0-49.9, adult (HCC)   Migraine with aura and with status migrainosus   OSA (obstructive sleep apnea)   Obesity hypoventilation syndrome (HCC)    Fall with syncope -Was not noted to be hypoglycemic at the time -Further evaluation with telemetry monitoring, carotid ultrasound, and 2D echocardiogram -Prior 2D echocardiogram 02/2022 with preserved LVEF and follows with  Dr. Allyson Sabal outpatient -Fall precautions  Right shoulder dislocation secondary to above -Reduced in ED -Continue in immobilizer -Follow-up with orthopedics outpatient  Elevated troponin -Plan to check CK level -Continue to trend -EKG noted with some mild ST changes, but no reciprocal changes and no chest pain -Discussed case with cardiology Dr. Mikal Plane who agrees that patient should be transferred and have 2D echocardiogram done -No need for heparin drip at this time -  Noted to have moderate CAD on coronary calcium scoring outpatient  Hypertension appears uncontrolled -Continue home blood pressure medications -Monitor carefully during stay and adjust medications as needed  Type 2 diabetes with hyperglycemia -Carb modified diet -Continue home medications and SSI coverage  Mild hyponatremia -Likely pseudohyponatremia in the setting of hyperglycemia -Continue to monitor  Thrombocytopenia -Continue to monitor and avoid heparin agents  OSA/OHS -Patient had been prescribed CPAP at bedtime, but does not wear  GERD -PPI  Morbid obesity -BMI 45.20 -Continue lifestyle changes outpatient   DVT prophylaxis: SCDs Code Status: Full Family Communication: Wife at bedside Disposition Plan:Admit for syncope evaluation and elevated troponin Consults called: Spoke with cardiology Dr. Mikal Plane who will see on transfer Admission status: Obs, Tele  Severity of Illness: The appropriate patient status for this patient is OBSERVATION. Observation status is judged to be reasonable and necessary in order to provide the required intensity of service to ensure the patient's safety. The patient's presenting symptoms, physical exam findings, and initial radiographic and laboratory data in the context of their medical condition is felt to place them at decreased risk for further clinical deterioration. Furthermore, it is anticipated that the patient will be medically stable for discharge from the  hospital within 2 midnights of admission.    Attie Nawabi D Sherryll Burger DO [facility]  If 7PM-7AM, please contact night-coverage [email or web address]  09/19/2023, 2:32 PM

## 2023-09-19 NOTE — Consult Note (Signed)
Neurology Consultation Reason for Consult: Seizure  Requesting Physician: Maurilio Lovely  CC: Found down, confused   History is obtained from: Wife and patient and chart review   HPI: Christopher Solomon is a 42 y.o. left-handed man with a past medical history significant for type 2 diabetes, hypertension, hyperlipidemia, coronary artery disease, BMI 46, OSA nonadherent to CPAP, generalized anxiety disorder on Xanax as needed, migraine headaches with visual aura, chart diagnosis of cluster headaches as well  Wife heard him get out of bed around 7 or 8 AM.  Shortly after she heard some strange sound in the bathroom and then heard that he was snoring around 745 or 8 AM.  She found him on the floor confused with scattered injuries including right shoulder dislocation, bruising under the left armpit, injured left great toenail. He was en route to [facility] due to troponin elevation and Cardiology evaluation. As [facility] came to get him, he had a tonic clonic seizure and was subsequently post-ictal. No prior history of seizures noted and CT head/cervical on admission were normal. Brain MRI was ordered along with Ativan 2 mg and Keppra 1500 mg load were administered.   Wife does note poor adherence to many of his medications generally.  Both patient and wife deny regular daily use of benzodiazepines, including no recent increase in use followed by discontinuation (though he has gotten 60 tablets of 0.5 mg filled monthly since July, he does occasionally have months that he does not fill his medication for example did not fill it between January and July per PDMP review).  No new medications.  No change in his sleep pattern but he is nonadherent to his CPAP as he has a fullface mask and typically sleeps on his belly.  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   Creatinine was 0.76, CK 891  Spell #  - Semiology: Generalized tonic-clonic seizure - Prodome: Dot in his vision distinct from typical migraine aura - Post-spell: Confusion - Triggers: No clear trigger - Frequency: First lifetime event today 09/19/2023  Risk factors:  Birth and development: Normal Febrile seizures in childhood: No Significant head trauma: No, did play football but denies any significant concussions Intracranial surgeries: No Mengingitis/Encephalitis history: No Family history of seizures or developmental delay: No  ROS: All other review of systems was negative except as noted in the HPI with caveat the patient was still very sleepy so majority of information obtained from wife  Past Medical History:  Diagnosis Date   Diabetes mellitus without complication (HCC)    Headaches, cluster    tension   High cholesterol    Hypertension    Past Surgical History:  Procedure Laterality Date   no surgical history     Family History  Problem Relation Age of Onset   Diabetes Paternal Grandfather    Cancer Paternal Grandmother    Lung cancer Maternal Grandmother    Social History:  reports that he quit smoking about 23 years ago. His smoking use included cigarettes. He has never used smokeless tobacco. He reports that he does not drink alcohol and does not use drugs.   Exam: Current vital signs: BP 118/68 (BP Location: Left Leg)   Pulse 89   Temp 97.7 F (36.5 C) (Oral)   Resp 18   Ht 5\' 10"  (1.778 m)   Wt Marland Kitchen)  145.2 kg   SpO2 95%   BMI 45.93 kg/m  Vital signs in last 24 hours: Temp:  [97.7 F (36.5 C)-99.1 F (37.3 C)] 97.7 F (36.5 C) (11/28 1719) Pulse Rate:  [83-101] 89 (11/28 1813) Resp:  [11-23] 18 (11/28 1719) BP: (71-162)/(27-102) 118/68 (11/28 1813) SpO2:  [72 %-97 %] 95 % (11/28 1813) Weight:  [142.9 kg-145.2 kg] 145.2 kg (11/28 1719)   Physical Exam  Constitutional: Appears well-developed and well-nourished.  Psych: Affect cooperative  when awake Eyes: No scleral injection HENT: No oropharyngeal obstruction.  MSK: Right shoulder dislocation Cardiovascular: Normal rate and regular rhythm. Perfusing extremities well Respiratory: Effort normal, non-labored breathing GI: Soft.  No distension. There is no tenderness.  Skin: Left armpit bruising.  Left great toe toenail injury  Neuro: Mental Status: Patient is very somnolent requiring repeated stimulation to maintain alertness Cranial Nerves: II: Visual Fields are full. Pupils are equal, round, and reactive to light.   III,IV, VI: EOMI without ptosis or diploplia.  V: Facial sensation is symmetric to temperature VII: Facial movement is symmetric.  VIII: hearing is intact to voice X: Uvula difficult to visualize, Mallampati score of 4 XI: Shoulder shrug is symmetric. XII: tongue is midline without atrophy or fasciculations.  Motor: Right upper extremity testing deferred due to acute dislocation and significant pain, he can move the right upper extremity gingerly to command.  No drift in the left upper extremity.  5/5 strength in the bilateral lower extremities on confrontational testing Sensory: Sensation is symmetric to light touch throughout Deep Tendon Reflexes: 2+ and symmetric in the left brachioradialis and patellae; did not tolerate right brachioradialis testing due to pain Cerebellar: Finger-to-nose intact on the left.  Heel-to-shin intact within limits of sleepiness Gait:  Deferred    I have reviewed labs in epic and the results pertinent to this consultation are:  Basic Metabolic Panel: Recent Labs  Lab 09/19/23 1135 09/19/23 1143  NA 134*  --   K 4.5  --   CL 98  --   CO2 24  --   GLUCOSE 282*  --   BUN 15  --   CREATININE 0.76  --   CALCIUM 9.5  --   MG  --  1.7    CBC: Recent Labs  Lab 09/19/23 1135  WBC 9.6  NEUTROABS 8.1*  HGB 16.4  HCT 47.4  MCV 92.8  PLT 138*    Coagulation Studies: No results for input(s): "LABPROT", "INR"  in the last 72 hours.    I have reviewed the images obtained: Head CT personally reviewed, no acute intracranial process  MRI brain personally reviewed, agree with radiology:  1. No acute intracranial abnormality. 2. Minor cerebral white matter disease, nonspecific, but most commonly related to chronic microvascular ischemic disease. 3. 2 cm arachnoid cyst overlying the anterior right frontal convexity.  Impression: New onset seizure without clear trigger.  Detailed discussion with wife at bedside (limited discussion with patient due to his somnolence) regarding seizure risk factors and precautions, likely focal seizure given distinct visual aura which does carry an 80% risk of seizure recurrence and therefore would recommend lifelong antiseizure medications going forward  Recommendations:  #Focal seizure with secondary generalization - UA, UDS to complete workup for triggering factors - Trend CK to peak, IV fluids per primary team - Routine EEG - Discussed that CPAP would be expected to improve quality of sleep and thereby reduce risk of seizure, patient encouraged to follow-up for potential alternative CPAP masks or other  strategies to improve adherence. - s/p Keppra 1500 mg load. Continue 750 mg BID (moderate dose given body habitus, balancing with his history of anxiety; side effects discussed with wife in detail including sleepiness that should improve over 1 to 2 weeks and potential risk for irritability or mood disturbance for which an alternative agent could be tried if needed) - Specifically discussed driving precautions, will need work accommodations - Ativan 2 mg every 4 hours as needed for seizure activity lasting greater than 5 minutes, please notify neurology if used for adjustment of standing anti seizure medications - Risk of benzodiazepine withdrawal seizures discussed; consider alternative antianxiety agent on an outpatient basis - Ambulatory referral to Dr. Teresa Coombs at  [facility] placed.  Discharge instructions updated with seizure precautions - Neurology will follow-up EEG but otherwise will sign off at this time.  Please do reach out if patient has any additional events or additional questions or concerns arise   Standard seizure precautions: Per [facility] statutes, patients with seizures are not allowed to drive until  they have been seizure-free for six months. Use caution when using heavy equipment or power tools. Avoid working on ladders or at heights. Take showers instead of baths. Ensure the water temperature is not too high on the home water heater. Do not go swimming alone. When caring for infants or small children, sit down when holding, feeding, or changing them to minimize risk of injury to the child in the event you have a seizure.  To reduce risk of seizures, maintain good sleep hygiene avoid alcohol and illicit drug use, take all anti-seizure medications as prescribed.    Brooke Dare MD-PhD [facility] [phone] Available 7 PM to 7 AM, outside of these hours please call Neurologist on call as listed on Amion.

## 2023-09-19 NOTE — ED Provider Notes (Signed)
I provided a substantive portion of the care of this patient.  I personally made/approved the management plan for this patient and take responsibility for the patient management.  EKG Interpretation Date/Time:  Thursday September 19 2023 09:02:38 EST Ventricular Rate:  85 PR Interval:  181 QRS Duration:  99 QT Interval:  366 QTC Calculation: 436 R Axis:   32  Text Interpretation: Sinus rhythm Low voltage, precordial leads ST elevation suggests acute pericarditis No significant change since last tracing Confirmed by Vanetta Mulders [phone]) on 09/19/2023 10:42:22 AM   See my note.   Vanetta Mulders, MD 09/19/23 1354

## 2023-09-19 NOTE — Discharge Instructions (Signed)
Please keep track of any episodes of the aura (dot in your vision) that you had today going forward.  Please also note any unexplained incontinence, tongue bites etc. Referral to [facility] will be placed for follow-up.  Please do call for an appointment which should be scheduled in the next 3 to 4 months [phone] -- if any further seizure activity call for sooner appointment  Please do not miss any doses of your Keppra and follow all other seizure precautions as listed below  Standard seizure precautions: Per [employer] statutes, patients with seizures are not allowed to drive until  they have been seizure-free for six months. Use caution when using heavy equipment or power tools. Avoid working on ladders or at heights. Take showers instead of baths. Ensure the water temperature is not too high on the home water heater. Do not go swimming alone. When caring for infants or small children, sit down when holding, feeding, or changing them to minimize risk of injury to the child in the event you have a seizure.  To reduce risk of seizures, maintain good sleep hygiene avoid alcohol and illicit drug use, take all anti-seizure medications as prescribed.

## 2023-09-19 NOTE — Progress Notes (Signed)
Orthopedic Tech Progress Note Patient Details:  Christopher Solomon [birth year] [id number]  Sling placed to RUE by Theone Murdoch, RN.   Patient ID: Christopher Solomon, male   DOB: 06-Apr-1981, 42 y.o.   MRN: [id number]  Docia Furl 09/19/2023, 8:24 PM

## 2023-09-19 NOTE — Progress Notes (Signed)
Gave report to [facility] 6E.

## 2023-09-19 NOTE — Progress Notes (Signed)
Rapid response called on patient after he had a witnessed tonic-clonic seizure after a brief staring episode. [facility] was at bedside to transfer patient and Dr. Mariea Clonts was at bedside to evaluate. He was given Ativan IV 2mg  and is now en route to [facility]. Keppra loading dose ordered along with Brain MRI and EEG. Neurology notified of the need for consultation.

## 2023-09-19 NOTE — Progress Notes (Signed)
Patient refused CPAP for the night

## 2023-09-19 NOTE — ED Triage Notes (Signed)
Wife states pt got up out of bed to go to bathroom and she was awakened by the patients loud "snoring" in the bathroom and when she went in she found pt lying in the bathroom; wife states "pt had a glazed look on his face"   Wife states pt's cbg with his dexcom was in the 280's  Pt is c/o severe pain to right shoulder with limited movement  Pt does not remember the fall

## 2023-09-20 ENCOUNTER — Encounter (HOSPITAL_COMMUNITY): Payer: Self-pay | Admitting: Internal Medicine

## 2023-09-20 ENCOUNTER — Observation Stay (HOSPITAL_COMMUNITY): Payer: 59

## 2023-09-20 DIAGNOSIS — S43004A Unspecified dislocation of right shoulder joint, initial encounter: Secondary | ICD-10-CM | POA: Diagnosis not present

## 2023-09-20 DIAGNOSIS — R7989 Other specified abnormal findings of blood chemistry: Secondary | ICD-10-CM

## 2023-09-20 DIAGNOSIS — E7800 Pure hypercholesterolemia, unspecified: Secondary | ICD-10-CM | POA: Diagnosis present

## 2023-09-20 DIAGNOSIS — W19XXXA Unspecified fall, initial encounter: Secondary | ICD-10-CM | POA: Diagnosis present

## 2023-09-20 DIAGNOSIS — I249 Acute ischemic heart disease, unspecified: Secondary | ICD-10-CM

## 2023-09-20 DIAGNOSIS — R569 Unspecified convulsions: Secondary | ICD-10-CM | POA: Diagnosis present

## 2023-09-20 DIAGNOSIS — E662 Morbid (severe) obesity with alveolar hypoventilation: Secondary | ICD-10-CM | POA: Diagnosis present

## 2023-09-20 DIAGNOSIS — Z833 Family history of diabetes mellitus: Secondary | ICD-10-CM | POA: Diagnosis not present

## 2023-09-20 DIAGNOSIS — R2681 Unsteadiness on feet: Secondary | ICD-10-CM | POA: Diagnosis present

## 2023-09-20 DIAGNOSIS — Z7985 Long-term (current) use of injectable non-insulin antidiabetic drugs: Secondary | ICD-10-CM | POA: Diagnosis not present

## 2023-09-20 DIAGNOSIS — I2489 Other forms of acute ischemic heart disease: Secondary | ICD-10-CM | POA: Diagnosis present

## 2023-09-20 DIAGNOSIS — G44009 Cluster headache syndrome, unspecified, not intractable: Secondary | ICD-10-CM | POA: Diagnosis present

## 2023-09-20 DIAGNOSIS — Z888 Allergy status to other drugs, medicaments and biological substances status: Secondary | ICD-10-CM | POA: Diagnosis not present

## 2023-09-20 DIAGNOSIS — K219 Gastro-esophageal reflux disease without esophagitis: Secondary | ICD-10-CM | POA: Diagnosis present

## 2023-09-20 DIAGNOSIS — D696 Thrombocytopenia, unspecified: Secondary | ICD-10-CM | POA: Diagnosis present

## 2023-09-20 DIAGNOSIS — Z885 Allergy status to narcotic agent status: Secondary | ICD-10-CM | POA: Diagnosis not present

## 2023-09-20 DIAGNOSIS — I10 Essential (primary) hypertension: Secondary | ICD-10-CM | POA: Diagnosis present

## 2023-09-20 DIAGNOSIS — E871 Hypo-osmolality and hyponatremia: Secondary | ICD-10-CM | POA: Diagnosis present

## 2023-09-20 DIAGNOSIS — Z87891 Personal history of nicotine dependence: Secondary | ICD-10-CM | POA: Diagnosis not present

## 2023-09-20 DIAGNOSIS — Z79899 Other long term (current) drug therapy: Secondary | ICD-10-CM | POA: Diagnosis not present

## 2023-09-20 DIAGNOSIS — Z6841 Body Mass Index (BMI) 40.0 and over, adult: Secondary | ICD-10-CM | POA: Diagnosis not present

## 2023-09-20 DIAGNOSIS — Z794 Long term (current) use of insulin: Secondary | ICD-10-CM | POA: Diagnosis not present

## 2023-09-20 DIAGNOSIS — S43014A Anterior dislocation of right humerus, initial encounter: Secondary | ICD-10-CM | POA: Diagnosis present

## 2023-09-20 DIAGNOSIS — E1165 Type 2 diabetes mellitus with hyperglycemia: Secondary | ICD-10-CM | POA: Diagnosis present

## 2023-09-20 DIAGNOSIS — R931 Abnormal findings on diagnostic imaging of heart and coronary circulation: Secondary | ICD-10-CM | POA: Diagnosis not present

## 2023-09-20 DIAGNOSIS — F05 Delirium due to known physiological condition: Secondary | ICD-10-CM | POA: Diagnosis present

## 2023-09-20 DIAGNOSIS — R55 Syncope and collapse: Secondary | ICD-10-CM | POA: Diagnosis present

## 2023-09-20 DIAGNOSIS — Y92009 Unspecified place in unspecified non-institutional (private) residence as the place of occurrence of the external cause: Secondary | ICD-10-CM | POA: Diagnosis not present

## 2023-09-20 DIAGNOSIS — I2510 Atherosclerotic heart disease of native coronary artery without angina pectoris: Secondary | ICD-10-CM | POA: Diagnosis present

## 2023-09-20 DIAGNOSIS — Z7984 Long term (current) use of oral hypoglycemic drugs: Secondary | ICD-10-CM | POA: Diagnosis not present

## 2023-09-20 LAB — URINALYSIS, ROUTINE W REFLEX MICROSCOPIC
Bilirubin Urine: NEGATIVE
Glucose, UA: >=500 mg/dL — AB
Hgb urine dipstick: NEGATIVE
Ketones, ur: NEGATIVE mg/dL
Leukocytes,Ua: NEGATIVE
Nitrite: NEGATIVE
Protein, ur: 30 mg/dL — AB
Specific Gravity, Urine: 1.02 (ref 1.005–1.030)
pH: 5 (ref 5.0–8.0)

## 2023-09-20 LAB — CBC
HCT: 45.8 % (ref 39.0–52.0)
Hemoglobin: 15.5 g/dL (ref 13.0–17.0)
MCH: 31.4 pg (ref 26.0–34.0)
MCHC: 33.8 g/dL (ref 30.0–36.0)
MCV: 92.9 fL (ref 80.0–100.0)
Platelets: 181 10*3/uL (ref 150–400)
RBC: 4.93 MIL/uL (ref 4.22–5.81)
RDW: 12.8 % (ref 11.5–15.5)
WBC: 16 10*3/uL — ABNORMAL HIGH (ref 4.0–10.5)
nRBC: 0 % (ref 0.0–0.2)

## 2023-09-20 LAB — ECHOCARDIOGRAM COMPLETE
AR max vel: 3.01 cm2
AV Area VTI: 3.08 cm2
AV Area mean vel: 2.96 cm2
AV Mean grad: 5 mm[Hg]
AV Peak grad: 9.4 mm[Hg]
Ao pk vel: 1.53 m/s
Area-P 1/2: 3.68 cm2
Height: 70 in
S' Lateral: 2.8 cm
Weight: 5121.73 [oz_av]

## 2023-09-20 LAB — BASIC METABOLIC PANEL
Anion gap: 12 (ref 5–15)
BUN: 26 mg/dL — ABNORMAL HIGH (ref 6–20)
CO2: 20 mmol/L — ABNORMAL LOW (ref 22–32)
Calcium: 9.1 mg/dL (ref 8.9–10.3)
Chloride: 101 mmol/L (ref 98–111)
Creatinine, Ser: 1.26 mg/dL — ABNORMAL HIGH (ref 0.61–1.24)
GFR, Estimated: >60 mL/min (ref >60)
Glucose, Bld: 345 mg/dL — ABNORMAL HIGH (ref 70–99)
Potassium: 5.6 mmol/L — ABNORMAL HIGH (ref 3.5–5.1)
Sodium: 133 mmol/L — ABNORMAL LOW (ref 135–145)

## 2023-09-20 LAB — RAPID URINE DRUG SCREEN, HOSP PERFORMED
Amphetamines: NOT DETECTED
Barbiturates: NOT DETECTED
Benzodiazepines: NOT DETECTED
Cocaine: NOT DETECTED
Opiates: NOT DETECTED
Tetrahydrocannabinol: NOT DETECTED

## 2023-09-20 LAB — TROPONIN I (HIGH SENSITIVITY)
Troponin I (High Sensitivity): 348 ng/L (ref <18)
Troponin I (High Sensitivity): 259 ng/L (ref <18)

## 2023-09-20 LAB — GLUCOSE, CAPILLARY
Glucose-Capillary: 351 mg/dL — ABNORMAL HIGH (ref 70–99)
Glucose-Capillary: 307 mg/dL — ABNORMAL HIGH (ref 70–99)
Glucose-Capillary: 250 mg/dL — ABNORMAL HIGH (ref 70–99)
Glucose-Capillary: 234 mg/dL — ABNORMAL HIGH (ref 70–99)

## 2023-09-20 LAB — MAGNESIUM: Magnesium: 1.9 mg/dL (ref 1.7–2.4)

## 2023-09-20 LAB — CK: Total CK: 1143 U/L — ABNORMAL HIGH (ref 49–397)

## 2023-09-20 MED ORDER — OXYCODONE HCL 5 MG PO TABS: 5.0000 mg | ORAL_TABLET | Freq: Four times a day (QID) | ORAL | Status: DC | PRN

## 2023-09-20 MED ORDER — INSULIN ASPART 100 UNIT/ML IJ SOLN
6.0000 [IU] | Freq: Three times a day (TID) | INTRAMUSCULAR | Status: DC
  Administered 2023-09-21 (×2): 6 [IU] via SUBCUTANEOUS

## 2023-09-20 MED ORDER — PERFLUTREN LIPID MICROSPHERE
1.0000 mL | INTRAVENOUS | Status: AC | PRN
  Administered 2023-09-20: 3 mL via INTRAVENOUS

## 2023-09-20 NOTE — Progress Notes (Addendum)
Patient Name: Christopher Solomon Date of Encounter: 09/20/2023 [facility] Cardiologist: Christopher Batty, MD    Interval Summary  .    Patient reports feeling OK this AM. He is having some pain in his right shoulder. Denies chest pain, shortness of breath.   Vital Signs .    Vitals:   09/19/23 2140 09/19/23 2240 09/19/23 2340 09/20/23 0358  BP: (!) 173/79 (!) 151/94 135/64 (!) 155/81  Pulse: (!) 102 (!) 107 (!) 106 (!) 104  Resp:   18 18  Temp:    98.8 F (37.1 C)  TempSrc:    Oral  SpO2: 96% 94% 94% 95%  Weight:      Height:        Intake/Output Summary (Last 24 hours) at 09/20/2023 0846 Last data filed at 09/19/2023 1900 Gross per 24 hour  Intake 100 ml  Output --  Net 100 ml      09/19/2023    5:19 PM 09/19/2023    2:51 PM 09/19/2023    2:42 PM  Last 3 Weights  Weight (lbs) 320 lb 1.7 oz 319 lb 14.2 oz 319 lb 14.2 oz  Weight (kg) 145.2 kg 145.1 kg 145.1 kg      Telemetry/ECG    Sinus rhythm-sinus tachycardia. HR in the 90s-100s - Personally Reviewed  Physical Exam .   GEN: No acute distress.  Laying flat in the bed. R arm in sling  Neck: No JVD Cardiac: RRR, no murmurs, rubs, or gallops.  Respiratory: Clear to auscultation on anterior exam. Normal work of breathing on room air  GI: Soft, nontender, non-distended  MS: No edema in BLE   Assessment & Plan .     Syncope  Tonic Clonic Seizure  - Patient presented to [facility] on 11/28 after an unwitnessed fall. Patient had been in the bathroom, and his wife found him down on the floor and confused. - While in the ED at [facility], patient had a witnessed tonic-clonic seizure and was subsequently post-ictal  - Neurology following - now on keppra   Elevated Troponin  - hsTn 10>147>294>302>348>259.  - CK 295>1884 - Patient denies chest pain - Previously had coronary CTA in 02/2022 that showed coronary calcium score of 610 (65th percentile), moderate stenosis. FFR showed no significant stenosis  - Suspect  demand ischemia due to tonic-clonic seizure.  - Echocardiogram pending- follow results, but I do not suspect any further cardiac workup will be necessary as an inpatient. Could consider nuclear stress test vs coronary CTA as an outpatient, but in the absence of chest pain, likely not indicated.   For questions or updates, please contact [facility] Please consult [email or web address] for contact info under        Signed, Jonita Albee, PA-C   Patient seen, examined. Available data reviewed. Agree with findings, assessment, and plan as outlined by Marijean Niemann, PA-C.  The patient is independently interviewed and examined.  His wife is at the bedside.  He is alert, oriented, in no distress.  Lungs are clear bilaterally, heart is regular rate and rhythm no murmur gallop, abdomen soft, obese, nontender, extremities have no edema.  Reviewed neurology evaluation.  The patient is noted to have nonobstructive CAD by coronary CTA in 2023.  The mild troponin elevation seen is to be expected with a seizure.  I do not think there is any sign of acute coronary syndrome or significant myocardial ischemia.  A 2D echo has been ordered and unless there is some  significant abnormality, I would not anticipate any further cardiac workup.  Otherwise as outlined above.  [name], M.D. 09/20/2023 10:21 AM

## 2023-09-20 NOTE — Progress Notes (Signed)
PT Cancellation Note  Patient Details Name: Christopher Solomon MRN: [id number] DOB: 11-08-1980   Cancelled Treatment:    Reason Eval/Treat Not Completed: Patient's level of consciousness  Recently received pain medication and now extremely drowsy, unable to stay awake long enough to converse with PT. Holding PT eval for now, will return as/if time/schedule allow.   Nedra Hai, PT, DPT 09/20/23 1:48 PM

## 2023-09-20 NOTE — Progress Notes (Signed)
Wife called concerned about patient feeling warm and red after ambulation to bathroom. Right shoulder and face did appear more red on assessment. Right shoulder also warmer than rest of body. Oral temp of 100.2. Patient states no pain or feeling anything out the ordinary. Reassured wife. Will continue to monitor.

## 2023-09-20 NOTE — Hospital Course (Signed)
42 y.o. male with medical history significant for hypertension, type 2 diabetes, dyslipidemia, migraine headaches, OSA on CPAP, and morbid obesity who presented to the ED after experiencing a fall at home with noted headache and shoulder pain.  This occurred at about 815 this morning.  His wife heard him fall in the bathroom, but was unsure of the noise as she was asleep, but within 5 minutes he was heard snoring and so she went to check on him and found him on the floor of the bathroom.  He was noted to have a red mark on his head where he had hit his head and complained of right sided shoulder pain.  Patient states that he felt kind of dizzy and had a few light flashes just prior to passing out.  He has never had a syncopal episode like this previously.  No seizure activity was witnessed and patient has not had an episode of passing out like this previously.  He denies any speech deficits or visual changes or any focal deficits of weakness or numbness to his extremities.  He denies any palpitations or chest pain. Pt was initially planned to transfer to [facility] for cardiac workup. While being transported, pt developed a witnessed seizure. Neurology was consulted

## 2023-09-20 NOTE — Plan of Care (Signed)
Brief Neuro Update:  Reviewed rEEG with no epileptiform discharges. Patient is okay to discharge from a neuro stand point. Please see Dr. Rollene Fare note from last night for full recs. We will signoff. Please feel free to contact us with any questions or concerns.  Follow up with neurology outpatient.  UDS and UA not collected yet. I reached out to RN to see if we can get this sooner. Will also notify Dr. Rhona Leavens.  Erick Blinks [facility]

## 2023-09-20 NOTE — Progress Notes (Signed)
  Echocardiogram 2D Echocardiogram has been performed.  Reinaldo Raddle Tish Begin 09/20/2023, 12:02 PM

## 2023-09-20 NOTE — Progress Notes (Signed)
EEG complete - results pending 

## 2023-09-20 NOTE — Procedures (Signed)
Patient Name: TREVEION RECIO  MRN: [id number]  Epilepsy Attending: Charlsie Quest  Referring Physician/Provider: Maurilio Lovely D, DO  Date: 09/20/2023 Duration: 25.39 mins  Patient history: 42 yo M with new onset seizure without clear trigger. EEG to evaluate for seizure   Level of alertness: Awake, asleep  AEDs during EEG study: LEV  Technical aspects: This EEG study was done with scalp electrodes positioned according to the 10-20 International system of electrode placement. Electrical activity was reviewed with band pass filter of 1-70Hz , sensitivity of 7 uV/mm, display speed of 90mm/sec with a 60Hz  notched filter applied as appropriate. EEG data were recorded continuously and digitally stored.  Video monitoring was available and reviewed as appropriate.  Description: The posterior dominant rhythm consists of 7.5Hz  activity of moderate voltage (25-35 uV) seen predominantly in posterior head regions, symmetric and reactive to eye opening and eye closing. leep was characterized by vertex waves, sleep spindles (12 to 14 Hz), maximal frontocentral region. Hyperventilation and photic stimulation were not performed.     IMPRESSION: This study is within normal limits. No seizures or epileptiform discharges were seen throughout the recording.  A normal interictal EEG does not exclude the diagnosis of epilepsy.  Annalia Metzger Annabelle Harman

## 2023-09-20 NOTE — Progress Notes (Signed)
Progress Note   Patient: Christopher Solomon:[id number] DOB: 02/20/1981 DOA: 09/19/2023     0 DOS: the patient was seen and examined on 09/20/2023   Brief hospital course: 42 y.o. male with medical history significant for hypertension, type 2 diabetes, dyslipidemia, migraine headaches, OSA on CPAP, and morbid obesity who presented to the ED after experiencing a fall at home with noted headache and shoulder pain.  This occurred at about 815 this morning.  His wife heard him fall in the bathroom, but was unsure of the noise as she was asleep, but within 5 minutes he was heard snoring and so she went to check on him and found him on the floor of the bathroom.  He was noted to have a red mark on his head where he had hit his head and complained of right sided shoulder pain.  Patient states that he felt kind of dizzy and had a few light flashes just prior to passing out.  He has never had a syncopal episode like this previously.  No seizure activity was witnessed and patient has not had an episode of passing out like this previously.  He denies any speech deficits or visual changes or any focal deficits of weakness or numbness to his extremities.  He denies any palpitations or chest pain. Pt was initially planned to transfer to [facility] for cardiac workup. While being transported, pt developed a witnessed seizure. Neurology was consulted  Assessment and Plan: Fall with syncope likely secondary to new seizure diagnosis -Witnessed seizure activity on transport to [facility] -EEG unremarkable. Given findings, neurology has recommended indefinite keppra 750mg  bid -Pt to follow up with Neurology as outpatient -Pt is aware he is not to operate a motor vehicle for 6 months -today, pt remains groggy, unsteady on his feet. Tired appearing. Suspect residual post-ictal phase  -PT/OT was consulted to eval   Right shoulder dislocation secondary to above -Reduced in ED -Continue in immobilizer -Follow-up with orthopedics  outpatient   Elevated troponin -Cardiology was consulted -Per Cardiology, the mild trop elevation seen is expected in seizure. 2d echo was performed with no WMA noted, reviewed -Cardiology does not anticipate any further cardiac workup   Hypertension appears uncontrolled -Continue home blood pressure medications   Type 2 diabetes with hyperglycemia -Carb modified diet -Continue home medications and SSI coverage -Have added 6 units meal coverage per diabetic coordinator recs   Mild hyponatremia -Likely pseudohyponatremia in the setting of hyperglycemia -Continue to monitor   Thrombocytopenia -recheck cbc in AM   OSA/OHS -Patient had been prescribed CPAP at bedtime -advised better compliance   GERD -PPI   Morbid obesity -BMI 45.20 -Continue lifestyle changes outpatient   Subjective: Without complaints when seen. Pt remains drowsy and tired appearing  Physical Exam: Vitals:   09/19/23 2340 09/20/23 0358 09/20/23 1134 09/20/23 1703  BP: 135/64 (!) 155/81 121/61 (!) 156/88  Pulse: (!) 106 (!) 104 93   Resp: 18 18 16 20   Temp:  98.8 F (37.1 C)    TempSrc:  Oral    SpO2: 94% 95% 94% 98%  Weight:      Height:       General exam: Awake, laying in bed, in nad Respiratory system: Normal respiratory effort, no wheezing Cardiovascular system: regular rate, s1, s2 Gastrointestinal system: Soft, nondistended, positive BS Central nervous system: CN2-12 grossly intact, strength intact Extremities: Perfused, no clubbing Skin: Normal skin turgor, no notable skin lesions seen Psychiatry: Mood normal // no visual hallucinations   Data  Reviewed:  There are no new results to review at this time.  Family Communication: Pt in room, family at bedside  Disposition: Status is: Inpatient Remains inpatient appropriate because: Severity of illness  Planned Discharge Destination: Home    Author: Rickey Barbara, MD 09/20/2023 5:50 PM  For on call review [email or web address].

## 2023-09-20 NOTE — Inpatient Diabetes Management (Addendum)
Inpatient Diabetes Program Recommendations  AACE/ADA: New Consensus Statement on Inpatient Glycemic Control (2015)  Target Ranges:  Prepandial:   less than 140 mg/dL      Peak postprandial:   less than 180 mg/dL (1-2 hours)      Critically ill patients:  140 - 180 mg/dL   Lab Results  Component Value Date   GLUCAP 307 (H) 09/20/2023   HGBA1C 8.2 (H) 09/19/2023    Review of Glycemic Control  Latest Reference Range & Units 09/19/23 09:00 09/19/23 16:30 09/19/23 17:51 09/19/23 21:19 09/19/23 22:05 09/20/23 06:05 09/20/23 11:31  Glucose-Capillary 70 - 99 mg/dL 454 (H) 098 (H) 119 (H) 278 (H) 274 (H) 351 (H) 307 (H)   Diabetes history: DM type 2 Outpatient Diabetes medications: Omnipod insulin pump with Humalog insulin Insulin pump settings Target 130 Covers when glucose is 170 Carb ratio 1:6 Sensitivity 1:23 Insulin on board 3 hours Total basal in a 24 hour period pt operates pump in Auto mode. Approx 36 units of basal insulin in a 24 hour period Current orders for Inpatient glycemic control:  Semglee 40 units bid Novolog 0-15 units tid + hs  Inpatient Diabetes Program Recommendations:    -   Add Novolog 6 units tid meal coverage if eating >50% of meals  Spoke with pt at bedside regarding his insulin pump settings and glucose control at home. Pt desires to use SQ regimen for now until closer time for him to be discharged then he would like to consider being placed back on his insulin pump. I supplied a CGM to replace the one that was taken off due to radiologic procedures.   Thanks,  Christena Deem RN, MSN, BC-ADM Inpatient Diabetes Coordinator Team Pager [phone] (8a-5p)

## 2023-09-21 ENCOUNTER — Other Ambulatory Visit (HOSPITAL_COMMUNITY): Payer: Self-pay

## 2023-09-21 DIAGNOSIS — R0609 Other forms of dyspnea: Secondary | ICD-10-CM

## 2023-09-21 DIAGNOSIS — R569 Unspecified convulsions: Secondary | ICD-10-CM | POA: Diagnosis not present

## 2023-09-21 DIAGNOSIS — S43004A Unspecified dislocation of right shoulder joint, initial encounter: Secondary | ICD-10-CM

## 2023-09-21 DIAGNOSIS — R931 Abnormal findings on diagnostic imaging of heart and coronary circulation: Secondary | ICD-10-CM | POA: Diagnosis not present

## 2023-09-21 LAB — GLUCOSE, CAPILLARY
Glucose-Capillary: 172 mg/dL — ABNORMAL HIGH (ref 70–99)
Glucose-Capillary: 338 mg/dL — ABNORMAL HIGH (ref 70–99)

## 2023-09-21 LAB — CBC
HCT: 43.4 % (ref 39.0–52.0)
Hemoglobin: 14.5 g/dL (ref 13.0–17.0)
MCH: 31 pg (ref 26.0–34.0)
MCHC: 33.4 g/dL (ref 30.0–36.0)
MCV: 92.9 fL (ref 80.0–100.0)
Platelets: 128 10*3/uL — ABNORMAL LOW (ref 150–400)
RBC: 4.67 MIL/uL (ref 4.22–5.81)
RDW: 12.5 % (ref 11.5–15.5)
WBC: 11.9 10*3/uL — ABNORMAL HIGH (ref 4.0–10.5)
nRBC: 0 % (ref 0.0–0.2)

## 2023-09-21 LAB — COMPREHENSIVE METABOLIC PANEL
ALT: 26 U/L (ref 0–44)
AST: 34 U/L (ref 15–41)
Albumin: 3.7 g/dL (ref 3.5–5.0)
Alkaline Phosphatase: 71 U/L (ref 38–126)
Anion gap: 8 (ref 5–15)
BUN: 29 mg/dL — ABNORMAL HIGH (ref 6–20)
CO2: 26 mmol/L (ref 22–32)
Calcium: 9.2 mg/dL (ref 8.9–10.3)
Chloride: 102 mmol/L (ref 98–111)
Creatinine, Ser: 0.95 mg/dL (ref 0.61–1.24)
GFR, Estimated: >60 mL/min (ref >60)
Glucose, Bld: 172 mg/dL — ABNORMAL HIGH (ref 70–99)
Potassium: 4.1 mmol/L (ref 3.5–5.1)
Sodium: 136 mmol/L (ref 135–145)
Total Bilirubin: 1.1 mg/dL (ref <1.2)
Total Protein: 7.5 g/dL (ref 6.5–8.1)

## 2023-09-21 MED ORDER — LEVETIRACETAM 750 MG PO TABS
750.0000 mg | ORAL_TABLET | Freq: Two times a day (BID) | ORAL | 0 refills | Status: DC
  Filled 2023-09-21: qty 60, 30d supply, fill #0

## 2023-09-21 MED ORDER — PANTOPRAZOLE SODIUM 40 MG PO TBEC
40.0000 mg | DELAYED_RELEASE_TABLET | Freq: Every day | ORAL | 0 refills | Status: DC
  Filled 2023-09-21: qty 30, 30d supply, fill #0

## 2023-09-21 NOTE — Progress Notes (Signed)
Occupational Therapy Evaluation Patient Details Name: Christopher Solomon MRN: [id number] DOB: [birth year] Today's Date: 09/21/2023   History of Present Illness 42 y.o. male. Admitted 09/19/23 after experiencing a fall at home with noted headache and shoulder pain. While being transported, pt developed a witnessed seizure. X ray revealed R shoulder was dislocated, reduced in ED. EEG within normal limits. PMH includes hypertension, type 2 diabetes, dyslipidemia, migraine headaches, OSA on CPAP, and morbid obesity   Clinical Impression   Pt is typically independent in mobility and ADL. 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 Today he presents slightly unsteady on his feet, but very very close to functional baseline. Pt reporting high levels of pain in RUE/shoulder. Pt is able to move against gravity and has full ROM at elbow, wrist, digits, limited by pain at shoulder. Educated in compensatory strategies for bathing/dressing. Also recommending supervision while in the bathroom. Pt and wife verbalized agreement. At this time, Pt is planning on dc'ing today. No OT follow up at this time.       If plan is discharge home, recommend the following: A little help with bathing/dressing/bathroom;Assistance with cooking/housework    Functional Status Assessment  Patient has had a recent decline in their functional status and demonstrates the ability to make significant improvements in function in a reasonable and predictable amount of time.  Equipment Recommendations  None recommended by OT    Recommendations for Other Services PT consult     Precautions / Restrictions Precautions Precautions: Fall Precaution Comments: seizure Restrictions Other Position/Activity Restrictions: no orders in chart for WB RUE      Mobility Bed Mobility Overal bed mobility: Needs Assistance Bed Mobility: Sit to Supine       Sit to supine:  Supervision   General bed mobility comments: line management    Transfers Overall transfer level: Needs assistance   Transfers: Sit to/from Stand Sit to Stand: Supervision           General transfer comment: for safety. upon initial standing Pt with LOB that he self corrected but initially unaware was happening      Balance Overall balance assessment: Mild deficits observed, not formally tested                                         ADL either performed or assessed with clinical judgement   ADL Overall ADL's : At baseline                                       General ADL Comments: suggested supervision in the bathroom, Pt and wife in agreement. educated on sequencing for dressing with RUE first in and last out.     Vision Baseline Vision/History: 0 No visual deficits Ability to See in Adequate Light: 0 Adequate Patient Visual Report: No change from baseline Vision Assessment?: No apparent visual deficits     Perception         Praxis         Pertinent Vitals/Pain Pain Assessment Pain Assessment: Faces Faces Pain Scale: Hurts little more Pain Location: R shoulder Pain Descriptors / Indicators: Aching, Discomfort, Grimacing, Guarding Pain Intervention(s): Limited activity within patient's tolerance, Monitored during session, Repositioned (sling for comfort)  Extremity/Trunk Assessment Upper Extremity Assessment Upper Extremity Assessment: RUE deficits/detail;Left hand dominant RUE Deficits / Details: painful at rest and when donning sling. digits, wrist and elbow WFL RUE: Unable to fully assess due to pain;Unable to fully assess due to immobilization RUE Sensation: WNL RUE Coordination: decreased gross motor   Lower Extremity Assessment Lower Extremity Assessment: Defer to PT evaluation       Communication Communication Communication: No apparent difficulties Cueing Techniques: Tactile cues   Cognition Arousal:  Alert Behavior During Therapy: Flat affect Overall Cognitive Status: Impaired/Different from baseline Area of Impairment: Safety/judgement, Awareness                         Safety/Judgement: Decreased awareness of safety Awareness: Emergent   General Comments: wife there to receive all education, Pt flat throughout and seeming decreased awareness of LOB initially     General Comments  wife present, VSS on RA    Exercises     Shoulder Instructions      Home Living Family/patient expects to be discharged to:: Private residence Living Arrangements: Spouse/significant other;Children (67 y/o son) Available Help at Discharge: Family;Available 24 hours/day Type of Home: House Home Access: Stairs to enter [employer] of Steps: 3 Entrance Stairs-Rails: None Home Layout: One level     Bathroom Shower/Tub: Chief Strategy Officer: Handicapped height Bathroom Accessibility: Yes How Accessible: Accessible via walker Home Equipment: None   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      Prior Functioning/Environment Prior Level of Function : Independent/Modified Independent;Working/employed;Driving                        OT Problem List: Decreased range of motion;Impaired balance (sitting and/or standing);Decreased safety awareness;Obesity;Impaired UE functional use;Pain      OT Treatment/Interventions:      OT Goals(Current goals can be found in the care plan section) Acute Rehab OT Goals Patient Stated Goal: get abck to work safely asap OT Goal Formulation: With patient Time For Goal Achievement: 10/05/23 Potential to Achieve Goals: Good  OT Frequency:      Co-evaluation   Reason for Co-Treatment: To address functional/ADL transfers PT goals addressed during session: Mobility/safety with mobility        AM-PAC OT "6 Clicks" Daily Activity     Outcome Measure Help from another person eating meals?:  None Help from another person taking care of personal grooming?: None Help from another person toileting, which includes using toliet, bedpan, or urinal?: None Help from another person bathing (including washing, rinsing, drying)?: None Help from another person to put on and taking off regular upper body clothing?: A Little Help from another person to put on and taking off regular lower body clothing?: A Little 6 Click Score: 22   End of Session Equipment Utilized During Treatment: Gait belt Nurse Communication: Mobility status  Activity Tolerance: Patient tolerated treatment well Patient left: in bed;with call bell/phone within reach;with family/visitor present  OT Visit Diagnosis: Unsteadiness on feet (R26.81);History of falling (Z91.81);Pain Pain - Right/Left: Right Pain - part of body: Shoulder                Time: 9562-1308 OT Time Calculation (min): 10 min Charges:  OT General Charges $OT Visit: 1 Visit OT Evaluation $OT Eval Low Complexity: 1 Low  Nyoka Cowden OTR/L Acute Rehabilitation Services Office: [phone]   [facility] 09/21/2023, 4:43 PM

## 2023-09-21 NOTE — Progress Notes (Signed)
   09/21/23 0034  BiPAP/CPAP/SIPAP  Reason BIPAP/CPAP not in use Non-compliant;Other(comment) (pt was originally on CPAP, couldn't get quite comfortable, RN place pt on [place]. RT spoke with wife about husband going back on and she "stated that since he's comfortable with the [place] we can hold off on CPAP for now. RT will followup as needed.)  BiPAP/CPAP /SiPAP Vitals  Pulse Rate 95  Resp 18  SpO2 95 %  MEWS Score/Color  MEWS Score 0  MEWS Score Color Christopher Solomon

## 2023-09-21 NOTE — Evaluation (Signed)
Physical Therapy Evaluation Patient Details Name: Christopher Solomon MRN: [id number] DOB: 01/25/1981 Today's Date: 09/21/2023  History of Present Illness  42 y.o. male. Admitted 09/19/23 after experiencing a fall at home with noted headache and shoulder pain. While being transported, pt developed a witnessed seizure. X ray revealed R shoulder was dislocated, reduced in ED. EEG within normal limits. PMH includes hypertension, type 2 diabetes, dyslipidemia, migraine headaches, OSA on CPAP, and morbid obesity  Clinical Impression  Pt is close to baseline functioning  with a new mild instability with static standing and should be safe at home . There are no further acute PT needs.  Will sign off at this time.         If plan is discharge home, recommend the following: Other (comment) (PRN assist)   Can travel by private vehicle        Equipment Recommendations None recommended by PT  Recommendations for Other Services       Functional Status Assessment Patient has had a recent decline in their functional status and demonstrates the ability to make significant improvements in function in a reasonable and predictable amount of time.     Precautions / Restrictions Precautions Precautions: Fall (more risk than at baseline) Precaution Comments: seizure Required Braces or Orthoses: Sling (for comfort R UE) Restrictions RUE Weight Bearing: Non weight bearing Other Position/Activity Restrictions: no orders in chart for WB RUE      Mobility  Bed Mobility Overal bed mobility: Needs Assistance Bed Mobility: Sit to Supine       Sit to supine: Supervision   General bed mobility comments: line management    Transfers Overall transfer level: Needs assistance Equipment used: None Transfers: Sit to/from Stand Sit to Stand: Supervision                Ambulation/Gait Ambulation/Gait assistance: Contact guard assist, Supervision Gait Distance (Feet): 200 Feet Assistive device:  None Gait Pattern/deviations: Step-through pattern   Gait velocity interpretation: 1.31 - 2.62 ft/sec, indicative of limited community ambulator   General Gait Details: Episodes of drift and when standing still listing off R without reacting until almost unable to recover.  Stairs Stairs: Yes Stairs assistance: Contact guard assist Stair Management: One rail Right, No rails, Alternating pattern, Forwards Number of Stairs: 5 General stair comments: safer with rails, but accomplished without rails  Wheelchair Mobility     Tilt Bed    Modified Rankin (Stroke Patients Only)       Balance Overall balance assessment: Mild deficits observed, not formally tested                                           Pertinent Vitals/Pain Pain Assessment Pain Assessment: Faces Pain Score: 4  Faces Pain Scale: Hurts little more Pain Location: R shoulder Pain Intervention(s): Monitored during session, Other (comment) (in sling)    Home Living Family/patient expects to be discharged to:: Private residence Living Arrangements: Spouse/significant other;Children (34 y/o son) Available Help at Discharge: Family;Available 24 hours/day Type of Home: House Home Access: Stairs to enter Entrance Stairs-Rails: None Entrance Stairs-Number of Steps: 3   Home Layout: One level Home Equipment: None 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    Prior Function Prior Level of Function : Independent/Modified Independent;Working/employed;Driving  Extremity/Trunk Assessment   Upper Extremity Assessment Upper Extremity Assessment: Defer to OT evaluation RUE Deficits / Details: painful at rest and when donning sling. digits, wrist and elbow WFL RUE: Unable to fully assess due to pain;Unable to fully assess due to immobilization RUE Sensation: WNL RUE Coordination: decreased gross motor    Lower Extremity Assessment Lower Extremity  Assessment: Overall WFL for tasks assessed    Cervical / Trunk Assessment Cervical / Trunk Assessment: Normal  Communication   Communication Communication: No apparent difficulties Cueing Techniques: Tactile cues  Cognition Arousal: Alert Behavior During Therapy: WFL for tasks assessed/performed Overall Cognitive Status: Within Functional Limits for tasks assessed                                          General Comments General comments (skin integrity, edema, etc.): wife present, VSS on RA    Exercises     Assessment/Plan    PT Assessment Patient needs continued PT services  PT Problem List Decreased balance       PT Treatment Interventions Gait training;Functional mobility training    PT Goals (Current goals can be found in the Care Plan section)  Acute Rehab PT Goals 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, working on my trucks PT Goal Formulation: All assessment and education complete, DC therapy    Frequency Min 1X/week     Co-evaluation PT/OT/SLP Co-Evaluation/Treatment: Yes Reason for Co-Treatment: To address functional/ADL transfers PT goals addressed during session: Mobility/safety with mobility         AM-PAC PT "6 Clicks" Mobility  Outcome Measure Help needed turning from your back to your side while in a flat bed without using bedrails?: A Little Help needed moving from lying on your back to sitting on the side of a flat bed without using bedrails?: A Little Help needed moving to and from a bed to a chair (including a wheelchair)?: A Little Help needed standing up from a chair using your arms (e.g., wheelchair or bedside chair)?: A Little Help needed to walk in hospital room?: A Little Help needed climbing 3-5 steps with a railing? : A Little 6 Click Score: 18    End of Session   Activity Tolerance: Patient tolerated treatment well Patient left: in chair;with call bell/phone within reach;with family/visitor present Nurse  Communication: Mobility status PT Visit Diagnosis: Other symptoms and signs involving the nervous system (R29.898);Other abnormalities of gait and mobility (R26.89)    Time: 5621-3086 PT Time Calculation (min) (ACUTE ONLY): 21 min   Charges:   PT Evaluation $PT Eval Low Complexity: 1 Low   PT General Charges $$ ACUTE PT VISIT: 1 Visit         09/21/2023  Jacinto Halim., PT Acute Rehabilitation Services [phone]  (office)  Eliseo Gum Dorwin Fitzhenry 09/21/2023, 4:50 PM

## 2023-09-21 NOTE — Progress Notes (Signed)
   Patient Name: Christopher Solomon Date of Encounter: 09/21/2023 [facility] Cardiologist: Nanetta Batty, MD      Reviewing notes from yday, anticipated if echo were normal no further inpatient cardiac workup advised  Will sign off     Signed, Sherryl Manges, MD

## 2023-09-21 NOTE — Discharge Summary (Signed)
Physician Discharge Summary   Patient: Christopher Solomon MRN: [id number] DOB: 02-Oct-1981  Admit date:     09/19/2023  Discharge date: 09/21/23  Discharge Physician: Rickey Barbara   PCP: Roe Rutherford, NP   Recommendations at discharge:   Follow up with PCP in 1-2 weeks Follow up with Neurology as scheduled Patient had been instructed not to drive for 6 months Recommend referral to Orthopedic Surgery  Discharge Diagnoses: Principal Problem:   Syncope Active Problems:   Diabetes type 2, controlled (HCC)   HTN (hypertension)   Hyperlipidemia   BMI 45.0-49.9, adult (HCC)   Migraine with aura and with status migrainosus   OSA (obstructive sleep apnea)   Obesity hypoventilation syndrome (HCC)   Elevated troponin   Seizure [facility])   Hospital Course: 42 y.o. male with medical history significant for hypertension, type 2 diabetes, dyslipidemia, migraine headaches, OSA on CPAP, and morbid obesity who presented to the ED after experiencing a fall at home with noted headache and shoulder pain.  This occurred at about 815 this morning.  His wife heard him fall in the bathroom, but was unsure of the noise as she was asleep, but within 5 minutes he was heard snoring and so she went to check on him and found him on the floor of the bathroom.  He was noted to have a red mark on his head where he had hit his head and complained of right sided shoulder pain.  Patient states that he felt kind of dizzy and had a few light flashes just prior to passing out.  He has never had a syncopal episode like this previously.  No seizure activity was witnessed and patient has not had an episode of passing out like this previously.  He denies any speech deficits or visual changes or any focal deficits of weakness or numbness to his extremities.  He denies any palpitations or chest pain. Pt was initially planned to transfer to [facility] for cardiac workup. While being transported, pt developed a witnessed seizure. Neurology was  consulted  Assessment and Plan: Fall with syncope likely secondary to new seizure diagnosis -Witnessed seizure activity on transport to [facility] -EEG unremarkable. Given findings, neurology has recommended indefinite keppra 750mg  bid -Pt to follow up with Neurology as outpatient -Pt is aware he is not to operate a motor vehicle for 6 months -Pt was seen by PT/OT   Right shoulder dislocation secondary to above -Reduced in ED -Continue in immobilizer -Follow-up with orthopedics outpatient   Elevated troponin -Cardiology was consulted -Per Cardiology, the mild trop elevation seen is expected in seizure. 2d echo was performed with no WMA noted, reviewed -Cardiology does not anticipate any further cardiac workup   Hypertension appears uncontrolled -Continue home blood pressure medications   Type 2 diabetes with hyperglycemia -Carb modified diet -Continue home medications and SSI coverage -Have added 6 units meal coverage per diabetic coordinator recs while in hospital -Cont home meds on d/c   Mild hyponatremia -Likely pseudohyponatremia in the setting of hyperglycemia -Continue to monitor   Thrombocytopenia -remained stable   OSA/OHS -Patient had been prescribed CPAP at bedtime -advised better compliance   GERD -PPI   Morbid obesity -BMI 45.20 -Continue lifestyle changes outpatient    Consultants: Cardiology, Neurology Procedures performed:   Disposition: Home Diet recommendation:  Carb modified diet DISCHARGE MEDICATION: Allergies as of 09/21/2023       Reactions   Codeine Nausea And Vomiting   Metoclopramide Other (See Comments)   Makes him very agitated;  can't stop moving.        Medication List     STOP taking these medications    omeprazole 40 MG capsule Commonly known as: PRILOSEC Replaced by: pantoprazole 40 MG tablet   potassium chloride 10 MEQ tablet Commonly known as: KLOR-CON M       TAKE these medications    ALPRAZolam 0.5 MG  tablet Commonly known as: XANAX Take 0.5 mg by mouth at bedtime as needed for anxiety.   carvedilol 3.125 MG tablet Commonly known as: COREG Take 3.125 mg by mouth 2 (two) times daily with a meal.   Dexcom G6 Transmitter Misc USE TO check sugars 6-8 TIMES DAILY (90 DAY supply)   hydrochlorothiazide 12.5 MG capsule Commonly known as: MICROZIDE Take 12.5 mg by mouth daily.   levETIRAcetam 750 MG tablet Commonly known as: KEPPRA Take 1 tablet (750 mg total) by mouth every 12 (twelve) hours.   Omnipod 5 DexG7G6 Pods Gen 5 Misc Inject into the skin every 3 (three) days. Insulin pump runs this continuously*   pantoprazole 40 MG tablet Commonly known as: PROTONIX Take 1 tablet (40 mg total) by mouth daily. Start taking on: September 22, 2023 Replaces: omeprazole 40 MG capsule   rosuvastatin 40 MG tablet Commonly known as: CRESTOR Take 1 tablet (40 mg total) by mouth daily.   tiZANidine 4 MG tablet Commonly known as: ZANAFLEX Take 4 mg by mouth every 6 (six) hours as needed for muscle spasms.   valsartan 320 MG tablet Commonly known as: DIOVAN Take 320 mg by mouth daily.   Zepbound 12.5 MG/0.5ML Pen Generic drug: tirzepatide Inject 12.5 mg into the skin once a week. What changed: additional instructions        Follow-up Information     Roe Rutherford, NP Follow up in 2 week(s).   Specialty: Adult Health Nurse Practitioner Why: Hospital follow up Contact information: [address] [place] [address] [phone]         Drema Dallas, DO Follow up.   Specialty: Neurology Why: Hospital follow up Contact information: [address] [place] [address] [phone]                Discharge Exam: Filed Weights   09/19/23 1442 09/19/23 1451 09/19/23 1719  Weight: (!) 145.1 kg (!) 145.1 kg (!) 145.2 kg   General exam: Awake, laying in bed, in nad Respiratory system: Normal respiratory effort, no  wheezing Cardiovascular system: regular rate, s1, s2 Gastrointestinal system: Soft, nondistended, positive BS Central nervous system: CN2-12 grossly intact, strength intact Extremities: Perfused, no clubbing Skin: Normal skin turgor, no notable skin lesions seen Psychiatry: Mood normal // no visual hallucinations   Condition at discharge: fair  The results of significant diagnostics from this hospitalization (including imaging, microbiology, ancillary and laboratory) are listed below for reference.   Imaging Studies: ECHOCARDIOGRAM COMPLETE  Result Date: 09/20/2023    ECHOCARDIOGRAM REPORT   Patient Name:   Christopher Solomon Date of Exam: 09/20/2023 Medical Rec #:  [id number]       Height:       70.0 in Accession #:    [id number]      Weight:       320.1 lb Date of Birth:  May 23, 1981       BSA:          2.549 m Patient Age:    42 years        BP:  155/81 mmHg Patient Gender: M               HR:           100 bpm. Exam Location:  Inpatient Procedure: 2D Echo, Cardiac Doppler, Color Doppler and Intracardiac            Opacification Agent Indications:    Elevated Troponin  History:        Patient has prior history of Echocardiogram examinations, most                 recent 03/20/2022. Risk Factors:Diabetes, Dyslipidemia,                 Hypertension and Former Smoker.  Sonographer:    Karma Ganja Referring Phys: Lamont Dowdy [facility] IMPRESSIONS  1. Left ventricular ejection fraction, by estimation, is 60 to 65%. The left ventricle has normal function. The left ventricle has no regional wall motion abnormalities. There is moderate concentric left ventricular hypertrophy. Indeterminate diastolic filling due to E-A fusion.  2. Right ventricular systolic function is normal. The right ventricular size is normal.  3. The mitral valve was not well visualized. No evidence of mitral valve regurgitation.  4. The aortic valve was not well visualized. Aortic valve regurgitation is not visualized.  5. The inferior  vena cava is normal in size with greater than 50% respiratory variability, suggesting right atrial pressure of 3 mmHg. Comparison(s): No significant change from prior study. Conclusion(s)/Recommendation(s): Normal biventricular function without evidence of hemodynamically significant valvular heart disease. FINDINGS  Left Ventricle: Left ventricular ejection fraction, by estimation, is 60 to 65%. The left ventricle has normal function. The left ventricle has no regional wall motion abnormalities. Definity contrast agent was given IV to delineate the left ventricular  endocardial borders. The left ventricular internal cavity size was normal in size. There is moderate concentric left ventricular hypertrophy. Indeterminate diastolic filling due to E-A fusion. Right Ventricle: The right ventricular size is normal. Right ventricular systolic function is normal. Left Atrium: Left atrial size was normal in size. Right Atrium: Right atrial size was normal in size. Pericardium: There is no evidence of pericardial effusion. Mitral Valve: The mitral valve was not well visualized. No evidence of mitral valve regurgitation. Tricuspid Valve: Tricuspid valve regurgitation is not demonstrated. Aortic Valve: The aortic valve was not well visualized. Aortic valve regurgitation is not visualized. Aortic valve mean gradient measures 5.0 mmHg. Aortic valve peak gradient measures 9.4 mmHg. Aortic valve area, by VTI measures 3.08 cm. Pulmonic Valve: Pulmonic valve regurgitation is not visualized. Aorta: The aortic root and ascending aorta are structurally normal, with no evidence of dilitation. Venous: The inferior vena cava is normal in size with greater than 50% respiratory variability, suggesting right atrial pressure of 3 mmHg. IAS/Shunts: The interatrial septum was not well visualized.  LEFT VENTRICLE PLAX 2D LVIDd:         5.00 cm   Diastology LVIDs:         2.80 cm   LV e' medial:    8.16 cm/s LV PW:         1.30 cm   LV E/e'  medial:  10.7 LV IVS:        1.40 cm   LV e' lateral:   13.40 cm/s LVOT diam:     2.10 cm   LV E/e' lateral: 6.5 LV SV:         78 LV SV Index:   30 LVOT Area:  3.46 cm  RIGHT VENTRICLE            IVC RV Basal diam:  3.90 cm    IVC diam: 1.80 cm RV S prime:     5.66 cm/s TAPSE (M-mode): 2.4 cm LEFT ATRIUM             Index        RIGHT ATRIUM           Index LA diam:        4.10 cm 1.61 cm/m   RA Area:     18.30 cm LA Vol (A2C):   47.6 ml 18.67 ml/m  RA Volume:   53.90 ml  21.14 ml/m LA Vol (A4C):   37.7 ml 14.79 ml/m LA Biplane Vol: 42.9 ml 16.83 ml/m  AORTIC VALVE AV Area (Vmax):    3.01 cm AV Area (Vmean):   2.96 cm AV Area (VTI):     3.08 cm AV Vmax:           153.00 cm/s AV Vmean:          104.000 cm/s AV VTI:            0.252 m AV Peak Grad:      9.4 mmHg AV Mean Grad:      5.0 mmHg LVOT Vmax:         133.00 cm/s LVOT Vmean:        89.000 cm/s LVOT VTI:          0.224 m LVOT/AV VTI ratio: 0.89  AORTA Ao Root diam: 3.60 cm Ao Asc diam:  3.30 cm MITRAL VALVE MV Area (PHT): 3.68 cm    SHUNTS MV Decel Time: 206 msec    Systemic VTI:  0.22 m MV E velocity: 87.50 cm/s  Systemic Diam: 2.10 cm MV A velocity: 66.60 cm/s MV E/A ratio:  1.31 Halford Decamp signed by Carolan Clines Signature Date/Time: 09/20/2023/12:26:58 PM    Final    EEG adult  Result Date: 09/20/2023 Charlsie Quest, MD     09/20/2023  9:16 AM Patient Name: Christopher Solomon MRN: [id number] Epilepsy Attending: Charlsie Quest Referring Physician/Provider: Maurilio Lovely D, DO Date: 09/20/2023 Duration: 25.39 mins Patient history: 42 yo M with new onset seizure without clear trigger. EEG to evaluate for seizure Level of alertness: Awake, asleep AEDs during EEG study: LEV Technical aspects: This EEG study was done with scalp electrodes positioned according to the 10-20 International system of electrode placement. Electrical activity was reviewed with band pass filter of 1-70Hz , sensitivity of 7 uV/mm, display speed of 42mm/sec  with a 60Hz  notched filter applied as appropriate. EEG data were recorded continuously and digitally stored.  Video monitoring was available and reviewed as appropriate. Description: The posterior dominant rhythm consists of 7.5Hz  activity of moderate voltage (25-35 uV) seen predominantly in posterior head regions, symmetric and reactive to eye opening and eye closing. leep was characterized by vertex waves, sleep spindles (12 to 14 Hz), maximal frontocentral region. Hyperventilation and photic stimulation were not performed.   IMPRESSION: This study is within normal limits. No seizures or epileptiform discharges were seen throughout the recording. A normal interictal EEG does not exclude the diagnosis of epilepsy. Charlsie Quest   MR BRAIN WO CONTRAST  Result Date: 09/19/2023 CLINICAL DATA:  Initial evaluation for new onset seizure. EXAM: MRI HEAD WITHOUT CONTRAST TECHNIQUE: Multiplanar, multiecho pulse sequences of the brain and surrounding structures were obtained without intravenous contrast. COMPARISON:  CT from  earlier the same day. FINDINGS: Brain: Cerebral volume within normal limits. Few scattered subcentimeter foci of T2/FLAIR hyperintensity seen involving the supratentorial cerebral white matter, nonspecific, but most commonly related to chronic microvascular ischemic disease. Changes are minor for age. No evidence for acute or subacute ischemia. Gray-white matter differentiation maintained. No areas of chronic cortical infarction or other insult. No acute or chronic intracranial blood products. Probable arachnoid cyst measuring up to approximately 2 cm seen overlying the anterior right frontal convexity (series 6, image 25). No other mass lesion, midline shift or mass effect. No hydrocephalus or extra-axial fluid collection. Pituitary gland suprasellar region within normal limits. Vascular: Major intracranial vascular flow voids are maintained. Skull and upper cervical spine: Craniocervical  junctional limits. Bone marrow signal intensity normal. No scalp soft tissue abnormality. Sinuses/Orbits: Globes and orbital soft tissues within normal limits. Paranasal sinuses are largely clear. Trace right mastoid effusion, of doubtful significance. Other: None. IMPRESSION: 1. No acute intracranial abnormality. 2. Minor cerebral white matter disease, nonspecific, but most commonly related to chronic microvascular ischemic disease. 3. 2 cm arachnoid cyst overlying the anterior right frontal convexity. Electronically Signed   By: Rise Mu M.D.   On: 09/19/2023 19:42   DG Shoulder Right  Result Date: 09/19/2023 CLINICAL DATA:  Status post reduction of right glenohumeral anterior dislocation EXAM: RIGHT SHOULDER - 2+ VIEW COMPARISON:  09/19/2023 at 10:39 a.m. FINDINGS: Internally rotated and trans-scapular views of the right shoulder are satisfactory to demonstrate successful reduction of the previous anterior dislocation. The transscapular view has a vertically exaggerated axis but accounting for this the humerus aligns with the glenoid. Subacromial morphology is type 2 (curved). No large or obvious Hill-Sachs impaction or bony Bankart fracture seen. IMPRESSION: 1. Successful reduction of the previous anterior dislocation of the right shoulder. Electronically Signed   By: Gaylyn Rong M.D.   On: 09/19/2023 14:14   CT Head Wo Contrast  Result Date: 09/19/2023 CLINICAL DATA:  Head trauma, moderate-severe; Polytrauma, blunt EXAM: CT HEAD WITHOUT CONTRAST CT CERVICAL SPINE WITHOUT CONTRAST TECHNIQUE: Multidetector CT imaging of the head and cervical spine was performed following the standard protocol without intravenous contrast. Multiplanar CT image reconstructions of the cervical spine were also generated. RADIATION DOSE REDUCTION: This exam was performed according to the departmental dose-optimization program which includes automated exposure control, adjustment of the mA and/or kV  according to patient size and/or use of iterative reconstruction technique. COMPARISON:  None Available. FINDINGS: CT HEAD FINDINGS Brain: No hemorrhage. No hydrocephalus. No extra-axial fluid collection. No CT evidence of an acute cortical infarct. No mass effect. No mass lesion. Vascular: No hyperdense vessel or unexpected calcification. Skull: Normal. Negative for fracture or focal lesion. Sinuses/Orbits: No middle ear or mastoid effusion. Paranasal sinuses are clear. Orbits are unremarkable. Other: None. CT CERVICAL SPINE FINDINGS Alignment: Straightening of the normal cervical lordosis. Skull base and vertebrae: No acute fracture. No primary bone lesion or focal pathologic process. Soft tissues and spinal canal: No prevertebral fluid or swelling. No visible canal hematoma. Disc levels:  No CT evidence of high-grade spinal canal stenosis. Upper chest: Negative. Other: None IMPRESSION: 1. No CT evidence of intracranial injury. 2. No acute fracture or traumatic subluxation of the cervical spine. Electronically Signed   By: Lorenza Cambridge M.D.   On: 09/19/2023 12:35   CT Cervical Spine Wo Contrast  Result Date: 09/19/2023 CLINICAL DATA:  Head trauma, moderate-severe; Polytrauma, blunt EXAM: CT HEAD WITHOUT CONTRAST CT CERVICAL SPINE WITHOUT CONTRAST TECHNIQUE: Multidetector CT imaging of the  head and cervical spine was performed following the standard protocol without intravenous contrast. Multiplanar CT image reconstructions of the cervical spine were also generated. RADIATION DOSE REDUCTION: This exam was performed according to the departmental dose-optimization program which includes automated exposure control, adjustment of the mA and/or kV according to patient size and/or use of iterative reconstruction technique. COMPARISON:  None Available. FINDINGS: CT HEAD FINDINGS Brain: No hemorrhage. No hydrocephalus. No extra-axial fluid collection. No CT evidence of an acute cortical infarct. No mass effect. No  mass lesion. Vascular: No hyperdense vessel or unexpected calcification. Skull: Normal. Negative for fracture or focal lesion. Sinuses/Orbits: No middle ear or mastoid effusion. Paranasal sinuses are clear. Orbits are unremarkable. Other: None. CT CERVICAL SPINE FINDINGS Alignment: Straightening of the normal cervical lordosis. Skull base and vertebrae: No acute fracture. No primary bone lesion or focal pathologic process. Soft tissues and spinal canal: No prevertebral fluid or swelling. No visible canal hematoma. Disc levels:  No CT evidence of high-grade spinal canal stenosis. Upper chest: Negative. Other: None IMPRESSION: 1. No CT evidence of intracranial injury. 2. No acute fracture or traumatic subluxation of the cervical spine. Electronically Signed   By: Lorenza Cambridge M.D.   On: 09/19/2023 12:35   DG Chest Port 1 View  Result Date: 09/19/2023 CLINICAL DATA:  Fall, syncope EXAM: PORTABLE CHEST 1 VIEW COMPARISON:  None Available. FINDINGS: The heart size and mediastinal contours are within normal limits. Both lungs are clear. Right glenohumeral joint is anteriorly dislocated. IMPRESSION: 1. No acute findings within the chest. 2. Right glenohumeral joint is dislocated. Electronically Signed   By: Duanne Guess D.O.   On: 09/19/2023 12:33   DG Shoulder Right  Result Date: 09/19/2023 CLINICAL DATA:  Found lying on the floor in the bathroom. Shoulder symptoms. EXAM: RIGHT SHOULDER - 2+ VIEW COMPARISON:  None Available. FINDINGS: Anterior dislocation of the humeral head, abutting the anterior margin of the glenoid. No convincing fracture.  No bone lesion. AC joint normally spaced and aligned. IMPRESSION: Anterior dislocation of the humeral head. No fracture identified. Electronically Signed   By: Amie Portland M.D.   On: 09/19/2023 10:53    Microbiology: Results for orders placed or performed during the hospital encounter of 05/20/23  SARS Coronavirus 2 by RT PCR (hospital order, performed in [facility] hospital lab) *cepheid single result test* Anterior Nasal Swab     Status: None   Collection Time: 05/20/23  1:20 PM   Specimen: Anterior Nasal Swab  Result Value Ref Range Status   SARS Coronavirus 2 by RT PCR NEGATIVE NEGATIVE Final    Comment: (NOTE) SARS-CoV-2 target nucleic acids are NOT DETECTED.  The SARS-CoV-2 RNA is generally detectable in upper and lower respiratory specimens during the acute phase of infection. The lowest concentration of SARS-CoV-2 viral copies this assay can detect is 250 copies / mL. A negative result does not preclude SARS-CoV-2 infection and should not be used as the sole basis for treatment or other patient management decisions.  A negative result may occur with improper specimen collection / handling, submission of specimen other than nasopharyngeal swab, presence of viral mutation(s) within the areas targeted by this assay, and inadequate number of viral copies (<250 copies / mL). A negative result must be combined with clinical observations, patient history, and epidemiological information.  Fact Sheet for Patients:   RoadLapTop.co.za  Fact Sheet for Healthcare Providers: http://kim-miller.com/  This test is not yet approved or  cleared by the [place] FDA and has  been authorized for detection and/or diagnosis of SARS-CoV-2 by FDA under an Emergency Use Authorization (EUA).  This EUA will remain in effect (meaning this test can be used) for the duration of the COVID-19 declaration under Section 564(b)(1) of the Act, 21 U.S.C. section 360bbb-3(b)(1), unless the authorization is terminated or revoked sooner.  Performed at [facility], [address] [place], [place] [address]     Labs: CBC: Recent Labs  Lab 09/19/23 1135 09/21/23 0313  WBC 9.6 11.9*  NEUTROABS 8.1*  --   HGB 16.4 14.5  HCT 47.4 43.4  MCV 92.8 92.9  PLT 138* 128*   Basic Metabolic  Panel: Recent Labs  Lab 09/19/23 1135 09/19/23 1143 09/21/23 0313  NA 134*  --  136  K 4.5  --  4.1  CL 98  --  102  CO2 24  --  26  GLUCOSE 282*  --  172*  BUN 15  --  29*  CREATININE 0.76  --  0.95  CALCIUM 9.5  --  9.2  MG  --  1.7  --    Liver Function Tests: Recent Labs  Lab 09/19/23 1135 09/21/23 0313  AST 27 34  ALT 31 26  ALKPHOS 69 71  BILITOT 0.8 1.1  PROT 7.6 7.5  ALBUMIN 4.1 3.7   CBG: Recent Labs  Lab 09/20/23 1131 09/20/23 1701 09/20/23 2026 09/21/23 0723 09/21/23 1215  GLUCAP 307* 250* 234* 172* 338*    Discharge time spent: less than 30 minutes.  Signed: Rickey Barbara, MD [facility] 09/21/2023

## 2023-09-23 ENCOUNTER — Encounter: Payer: Self-pay | Admitting: Neurology

## 2023-09-27 ENCOUNTER — Other Ambulatory Visit: Payer: Self-pay | Admitting: Adult Health Nurse Practitioner

## 2023-09-27 DIAGNOSIS — G8929 Other chronic pain: Secondary | ICD-10-CM

## 2023-09-27 DIAGNOSIS — S43004S Unspecified dislocation of right shoulder joint, sequela: Secondary | ICD-10-CM

## 2023-09-29 ENCOUNTER — Ambulatory Visit
Admission: RE | Admit: 2023-09-29 | Discharge: 2023-09-29 | Disposition: A | Discharge status: Home / Self Care | Payer: 59 | Source: Ambulatory Visit | Attending: Adult Health Nurse Practitioner | Admitting: Adult Health Nurse Practitioner

## 2023-09-29 DIAGNOSIS — G8929 Other chronic pain: Secondary | ICD-10-CM

## 2023-09-29 DIAGNOSIS — S43004S Unspecified dislocation of right shoulder joint, sequela: Secondary | ICD-10-CM

## 2023-10-01 NOTE — Progress Notes (Deleted)
Cardiology Clinic Note   Patient Name: Christopher Solomon Date of Encounter: 10/01/2023  Primary Care Provider:  Roe Rutherford, NP Primary Cardiologist:  Nanetta Batty, MD  Patient Profile    ***  Past Medical History    Past Medical History:  Diagnosis Date   Diabetes mellitus without complication (HCC)    Headaches, cluster    tension   High cholesterol    Hypertension    Past Surgical History:  Procedure Laterality Date   no surgical history      Allergies  Allergies  Allergen Reactions   Codeine Nausea And Vomiting   Metoclopramide Other (See Comments)    Makes him very agitated; can't stop moving.    History of Present Illness    ***  Home Medications    Prior to Admission medications   Medication Sig Start Date End Date Taking? Authorizing Provider  ALPRAZolam Prudy Feeler) 0.5 MG tablet Take 0.5 mg by mouth at bedtime as needed for anxiety.    [provider]  carvedilol (COREG) 3.125 MG tablet Take 3.125 mg by mouth 2 (two) times daily with a meal.    [provider]  Continuous Blood Gluc Transmit (DEXCOM G6 TRANSMITTER) MISC USE TO check sugars 6-8 TIMES DAILY (90 DAY supply) 10/31/21   [provider]  hydrochlorothiazide (MICROZIDE) 12.5 MG capsule Take 12.5 mg by mouth daily.    [provider]  Insulin Disposable Pump (OMNIPOD 5 DEXG7G6 PODS GEN 5) MISC Inject into the skin every 3 (three) days. Insulin pump runs this continuously*    [provider]  levETIRAcetam (KEPPRA) 750 MG tablet Take 1 tablet (750 mg total) by mouth every 12 (twelve) hours. 09/21/23 10/21/23  Jerald Kief, MD  pantoprazole (PROTONIX) 40 MG tablet Take 1 tablet (40 mg total) by mouth daily. 09/22/23 10/22/23  Jerald Kief, MD  rosuvastatin (CRESTOR) 40 MG tablet Take 1 tablet (40 mg total) by mouth daily. 03/25/23   Runell Gess, MD  tirzepatide (ZEPBOUND) 12.5 MG/0.5ML Pen Inject 12.5 mg into the skin once a week. Patient  taking differently: Inject 12.5 mg into the skin once a week. Wednesday 09/04/23     tiZANidine (ZANAFLEX) 4 MG tablet Take 4 mg by mouth every 6 (six) hours as needed for muscle spasms.    [provider]  valsartan (DIOVAN) 320 MG tablet Take 320 mg by mouth daily. 01/25/22   [provider]    Family History    Family History  Problem Relation Age of Onset   Diabetes Paternal Grandfather    Cancer Paternal Grandmother    Lung cancer Maternal Grandmother    He indicated that his mother is alive. He indicated that his father is deceased. He indicated that the status of his maternal grandmother is unknown. He indicated that the status of his paternal grandmother is unknown. He indicated that the status of his paternal grandfather is unknown. He indicated that his son is alive.  Social History    Social History   Socioeconomic History   Marital status: Married    Spouse name: [name]   Number of children: 1   Years of education: 12th grade   Highest education level: Not on file  Occupational History   Occupation: Street Teaching laboratory technician    Comment: [employer]  Tobacco Use   Smoking status: Former    Current packs/day: 0.00    Types: Cigarettes    Quit date: 10/23/1999    Years  since quitting: 23.9   Smokeless tobacco: Never  Substance and Sexual Activity   Alcohol use: No    Alcohol/week: 0.0 standard drinks of alcohol   Drug use: No   Sexual activity: Not on file  Other Topics Concern   Not on file  Social History Narrative   Lives with his wife and son.   Caffeine: 2 soft drinks/ day   Occasional coffee use   Social Determinants of Health   Financial Resource Strain: Low Risk  (03/15/2023)   Received from [facility], [facility]   Overall Financial Resource Strain (CARDIA)    Difficulty of Paying Living Expenses: Not hard at all  Food Insecurity: No Food Insecurity (09/19/2023)   Hunger Vital Sign    Worried About Running Out of  Food in the Last Year: Never true    Ran Out of Food in the Last Year: Never true  Transportation Needs: No Transportation Needs (09/19/2023)   PRAPARE - Administrator, Civil Service (Medical): No    Lack of Transportation (Non-Medical): No  Physical Activity: Unknown (11/20/2021)   Received from [facility], [facility]   Exercise Vital Sign    Days of Exercise per Week: 4 days    Minutes of Exercise per Session: Not on file  Stress: Unknown (11/20/2021)   Received from [facility], [facility] of Occupational Health - Occupational Stress Questionnaire    Feeling of Stress : Patient declined  Social Connections: Unknown (02/20/2022)   Received from [facility], [facility]   Social Network    Social Network: Not on file  Intimate Partner Violence: Not At Risk (09/19/2023)   Humiliation, Afraid, Rape, and Kick questionnaire    Fear of Current or Ex-Partner: No    Emotionally Abused: No    Physically Abused: No    Sexually Abused: No     Review of Systems    General:  No chills, fever, night sweats or weight changes.  Cardiovascular:  No chest pain, dyspnea on exertion, edema, orthopnea, palpitations, paroxysmal nocturnal dyspnea. Dermatological: No rash, lesions/masses Respiratory: No cough, dyspnea Urologic: No hematuria, dysuria Abdominal:   No nausea, vomiting, diarrhea, bright red blood per rectum, melena, or hematemesis Neurologic:  No visual changes, wkns, changes in mental status. All other systems reviewed and are otherwise negative except as noted above.  Physical Exam    VS:  There were no vitals taken for this visit. , BMI There is no height or weight on file to calculate BMI. GEN: Well nourished, well developed, in no acute distress. HEENT: normal. Neck: Supple, no JVD, carotid bruits, or masses. Cardiac: RRR, no murmurs, rubs, or gallops. No clubbing, cyanosis, edema.  Radials/DP/PT 2+ and equal bilaterally.   Respiratory:  Respirations regular and unlabored, clear to auscultation bilaterally. GI: Soft, nontender, nondistended, BS + x 4. MS: no deformity or atrophy. Skin: warm and dry, no rash. Neuro:  Strength and sensation are intact. Psych: Normal affect.  Accessory Clinical Findings    Recent Labs: 05/20/2023: B Natriuretic Peptide 47.8 09/20/2023: Magnesium 1.9 09/21/2023: ALT 26; BUN 29; Creatinine, Ser 0.95; Hemoglobin 14.5; Platelets 128; Potassium 4.1; Sodium 136   Recent Lipid Panel    Component Value Date/Time   CHOL 257 (H) 02/26/2023 1049   TRIG 235 (H) 02/26/2023 1049   HDL 48 02/26/2023 1049   CHOLHDL 5.4 (H) 02/26/2023 1049   LDLCALC 165 (H) 02/26/2023 1049    No BP recorded.  {Refresh Note OR  Click here to enter BP  :1}***    ECG personally reviewed by me today- ***          Assessment & Plan   1.  ***   Thomasene Ripple. Annayah Worthley NP-C     10/01/2023, 7:49 AM [facility] [address] Office [phone] Fax [phone]    I spent***minutes examining this patient, reviewing medications, and using patient centered shared decision making involving her cardiac care.   I spent greater than 20 minutes reviewing her past medical history,  medications, and prior cardiac tests.

## 2023-10-04 ENCOUNTER — Encounter (HOSPITAL_COMMUNITY): Payer: Self-pay | Admitting: Orthopedic Surgery

## 2023-10-04 ENCOUNTER — Other Ambulatory Visit: Payer: Self-pay

## 2023-10-04 NOTE — Progress Notes (Signed)
SDW CALL  Patient was given pre-op instructions over the phone. The opportunity was given for the patient to ask questions. No further questions asked. Patient verbalized understanding of instructions given.   PCP - Cam Hai Cardiologist - Obie Dredge  PPM/ICD - denies Device Orders -  Rep Notified -   Chest x-ray - port. 1v-09/19/23 EKG - 09/19/23 Stress Test - 04/04/21 ECHO - 09/20/23 Cardiac Cath - denies  Sleep Study - 02/04/2015 CPAP - yes  Fasting Blood Sugar - 150's Checks Blood Sugar - has a Dexcom  Blood Thinner Instructions:na Aspirin Instructions:na  ERAS Protcol -clears until 12:30 PRE-SURGERY Ensure or G2- no  COVID TEST- na   Anesthesia review:   Patient denies shortness of breath, fever, cough and chest pain over the phone call    Surgical Instructions    Your procedure is scheduled on Monday December 16  Report to [facility] Main Entrance "A" at 1300 P.M., then check in with the Admitting office.  Call this number if you have problems the morning of surgery:  [phone]    Remember:  Do not eat after midnight the night before your surgery  You may drink clear liquids until 1230PM the day of your surgery.   Clear liquids allowed are: Water, Non-Citrus Juices (without pulp), Carbonated Beverages, Clear Tea, Black Coffee ONLY (NO MILK, CREAM OR POWDERED CREAMER of any kind), and Gatorade   Take these medicines the morning of surgery with A SIP OF WATER:  Coreg,Kepra,Protonix,Crestor. PRN- Zanaflex  As of today, STOP taking any Aspirin (unless otherwise instructed by your surgeon) Aleve, Naproxen, Ibuprofen, Motrin, Advil, Goody's, BC's, all herbal medications, fish oil, and all vitamins.  WHAT DO I DO ABOUT MY DIABETES MEDICATION?  Do not take Tirzepatide(Zepbound) prior to surgery. Pt reports last dose was 09/27/23.   Pt states he has Omnipod insulin delivery system. He will contact Dr. Arturo Morton, his PCP for instructions  regarding Omnipod use on the day of surgery. He states his Omnipod is due to be changed out on 10/07/23(DOS). I spoke with Smith Mince, diabetes coordinator, who stated that patient could leave Omnipod on since surgery is scheduled to last 2 hours.  If patient has to remove his Omnipod per his MD, he informed me that he has an insulin pen containing Humalog insulin. I instructed him to administer that the day of surgery only if his blood sugar was >220 and then he should only take 1/2 his usual dose. T verbally acknowledged instructions.   Check your blood sugar the morning of your surgery when you wake up and every 2 hours until you get to the Short Stay unit.  If your blood sugar is less than 70 mg/dL, you will need to treat for low blood sugar: Do not take insulin. Treat a low blood sugar (less than 70 mg/dL) with  cup of clear juice (cranberry or apple), 4 glucose tablets, OR glucose gel. Recheck blood sugar in 15 minutes after treatment (to make sure it is greater than 70 mg/dL). If your blood sugar is not greater than 70 mg/dL on recheck, call [phone] for further instructions. Report your blood sugar to the short stay nurse when you get to Short Stay.   [facility] is not responsible for any belongings or valuables. .   Do NOT Smoke (Tobacco/Vaping)  24 hours prior to your procedure  If you use a CPAP at night, you may bring your mask for your overnight stay.   Contacts, glasses, hearing aids, dentures  or partials may not be worn into surgery, please bring cases for these belongings   Patients discharged the day of surgery will not be allowed to drive home, and someone needs to stay with them for 24 hours.   Special instructions:    Oral Hygiene is also important to reduce your risk of infection.  Remember - BRUSH YOUR TEETH THE MORNING OF SURGERY WITH YOUR REGULAR TOOTHPASTE   Day of Surgery:  Take a shower the day of or night before with antibacterial soap. Wear  Clean/Comfortable clothing the morning of surgery Do not apply any deodorants/lotions.   Do not wear jewelry or makeup Do not wear lotions, powders, perfumes/colognes, or deodorant. Do not shave 48 hours prior to surgery.  Men may shave face and neck. Do not bring valuables to the hospital. Do not wear nail polish, gel polish, artificial nails, or any other type of covering on natural nails (fingers and toes) If you have artificial nails or gel coating that need to be removed by a nail salon, please have this removed prior to surgery. Artificial nails or gel coating may interfere with anesthesia's ability to adequately monitor your vital signs. Remember to brush your teeth WITH YOUR REGULAR TOOTHPASTE.

## 2023-10-07 ENCOUNTER — Ambulatory Visit (HOSPITAL_COMMUNITY): Payer: 59 | Admitting: Anesthesiology

## 2023-10-07 ENCOUNTER — Ambulatory Visit (HOSPITAL_COMMUNITY)
Admission: RE | Admit: 2023-10-07 | Discharge: 2023-10-07 | Disposition: A | Discharge status: Home / Self Care | Payer: 59 | Attending: Orthopedic Surgery | Admitting: Orthopedic Surgery

## 2023-10-07 ENCOUNTER — Other Ambulatory Visit: Payer: Self-pay

## 2023-10-07 ENCOUNTER — Encounter (HOSPITAL_COMMUNITY)
Admission: RE | Disposition: A | Discharge status: Home / Self Care | Payer: Self-pay | Source: Home / Self Care | Attending: Orthopedic Surgery

## 2023-10-07 ENCOUNTER — Encounter (HOSPITAL_COMMUNITY): Payer: Self-pay | Admitting: Orthopedic Surgery

## 2023-10-07 DIAGNOSIS — K219 Gastro-esophageal reflux disease without esophagitis: Secondary | ICD-10-CM | POA: Diagnosis not present

## 2023-10-07 DIAGNOSIS — Z794 Long term (current) use of insulin: Secondary | ICD-10-CM | POA: Diagnosis not present

## 2023-10-07 DIAGNOSIS — Z833 Family history of diabetes mellitus: Secondary | ICD-10-CM | POA: Insufficient documentation

## 2023-10-07 DIAGNOSIS — Z6841 Body Mass Index (BMI) 40.0 and over, adult: Secondary | ICD-10-CM | POA: Diagnosis not present

## 2023-10-07 DIAGNOSIS — S43421A Sprain of right rotator cuff capsule, initial encounter: Secondary | ICD-10-CM

## 2023-10-07 DIAGNOSIS — E119 Type 2 diabetes mellitus without complications: Secondary | ICD-10-CM | POA: Diagnosis not present

## 2023-10-07 DIAGNOSIS — X58XXXA Exposure to other specified factors, initial encounter: Secondary | ICD-10-CM | POA: Insufficient documentation

## 2023-10-07 DIAGNOSIS — R569 Unspecified convulsions: Secondary | ICD-10-CM | POA: Diagnosis not present

## 2023-10-07 DIAGNOSIS — Z79899 Other long term (current) drug therapy: Secondary | ICD-10-CM | POA: Insufficient documentation

## 2023-10-07 DIAGNOSIS — I10 Essential (primary) hypertension: Secondary | ICD-10-CM | POA: Diagnosis not present

## 2023-10-07 DIAGNOSIS — I2510 Atherosclerotic heart disease of native coronary artery without angina pectoris: Secondary | ICD-10-CM | POA: Insufficient documentation

## 2023-10-07 DIAGNOSIS — G4730 Sleep apnea, unspecified: Secondary | ICD-10-CM | POA: Diagnosis not present

## 2023-10-07 DIAGNOSIS — S46011A Strain of muscle(s) and tendon(s) of the rotator cuff of right shoulder, initial encounter: Secondary | ICD-10-CM | POA: Insufficient documentation

## 2023-10-07 DIAGNOSIS — M25311 Other instability, right shoulder: Secondary | ICD-10-CM | POA: Diagnosis not present

## 2023-10-07 DIAGNOSIS — Z87891 Personal history of nicotine dependence: Secondary | ICD-10-CM | POA: Diagnosis not present

## 2023-10-07 HISTORY — DX: Gastro-esophageal reflux disease without esophagitis: K21.9

## 2023-10-07 HISTORY — DX: Myoneural disorder, unspecified: G70.9

## 2023-10-07 HISTORY — DX: Sleep apnea, unspecified: G47.30

## 2023-10-07 HISTORY — PX: SHOULDER OPEN ROTATOR CUFF REPAIR: SHX2407

## 2023-10-07 LAB — GLUCOSE, CAPILLARY
Glucose-Capillary: 117 mg/dL — ABNORMAL HIGH (ref 70–99)
Glucose-Capillary: 106 mg/dL — ABNORMAL HIGH (ref 70–99)
Glucose-Capillary: 96 mg/dL (ref 70–99)

## 2023-10-07 SURGERY — REPAIR, ROTATOR CUFF, OPEN
Anesthesia: Regional | Site: Shoulder | Laterality: Right

## 2023-10-07 MED ORDER — FENTANYL CITRATE (PF) 100 MCG/2ML IJ SOLN
INTRAMUSCULAR | Status: AC
  Filled 2023-10-07: qty 2

## 2023-10-07 MED ORDER — MIDAZOLAM HCL 2 MG/2ML IJ SOLN
INTRAMUSCULAR | Status: AC
  Administered 2023-10-07: 2 mg via INTRAVENOUS
  Filled 2023-10-07: qty 2

## 2023-10-07 MED ORDER — ACETAMINOPHEN 500 MG PO TABS: 1000.0000 mg | ORAL_TABLET | ORAL | Status: AC

## 2023-10-07 MED ORDER — SODIUM CHLORIDE 0.9 % IV SOLN
3.0000 g | INTRAVENOUS | Status: AC
  Administered 2023-10-07: 3 g via INTRAVENOUS
  Filled 2023-10-07: qty 3

## 2023-10-07 MED ORDER — LIDOCAINE 2% (20 MG/ML) 5 ML SYRINGE
INTRAMUSCULAR | Status: DC | PRN
  Administered 2023-10-07: 50 mg via INTRAVENOUS

## 2023-10-07 MED ORDER — FENTANYL CITRATE (PF) 250 MCG/5ML IJ SOLN
INTRAMUSCULAR | Status: AC
  Filled 2023-10-07: qty 5

## 2023-10-07 MED ORDER — ACETAMINOPHEN 500 MG PO TABS
ORAL_TABLET | ORAL | Status: AC
  Administered 2023-10-07: 1000 mg via ORAL
  Filled 2023-10-07: qty 2

## 2023-10-07 MED ORDER — OXYCODONE HCL 5 MG PO TABS
5.0000 mg | ORAL_TABLET | Freq: Once | ORAL | Status: AC
  Administered 2023-10-07: 5 mg via ORAL

## 2023-10-07 MED ORDER — SUCCINYLCHOLINE CHLORIDE 200 MG/10ML IV SOSY
PREFILLED_SYRINGE | INTRAVENOUS | Status: DC | PRN
  Administered 2023-10-07: 140 mg via INTRAVENOUS

## 2023-10-07 MED ORDER — DIPHENHYDRAMINE HCL 50 MG/ML IJ SOLN
INTRAMUSCULAR | Status: AC
Start: 2023-10-07 — End: ?
  Filled 2023-10-07: qty 1

## 2023-10-07 MED ORDER — PHENYLEPHRINE HCL-NACL 20-0.9 MG/250ML-% IV SOLN
INTRAVENOUS | Status: DC | PRN
  Administered 2023-10-07: 40 ug/min via INTRAVENOUS

## 2023-10-07 MED ORDER — TRANEXAMIC ACID-NACL 1000-0.7 MG/100ML-% IV SOLN
1000.0000 mg | INTRAVENOUS | Status: AC
Start: 2023-10-07 — End: 2023-10-07
  Administered 2023-10-07: 1000 mg via INTRAVENOUS
  Filled 2023-10-07: qty 100

## 2023-10-07 MED ORDER — VANCOMYCIN HCL 1000 MG IV SOLR
INTRAVENOUS | Status: DC | PRN
  Administered 2023-10-07: 1000 mg

## 2023-10-07 MED ORDER — DEXMEDETOMIDINE HCL IN NACL 80 MCG/20ML IV SOLN
INTRAVENOUS | Status: DC | PRN
  Administered 2023-10-07 (×2): 10 ug via INTRAVENOUS

## 2023-10-07 MED ORDER — OXYCODONE HCL 5 MG PO TABS: 5.0000 mg | ORAL_TABLET | ORAL | 0 refills | Status: DC | PRN

## 2023-10-07 MED ORDER — PROPOFOL 10 MG/ML IV BOLUS
INTRAVENOUS | Status: DC | PRN
  Administered 2023-10-07: 200 mg via INTRAVENOUS

## 2023-10-07 MED ORDER — SUGAMMADEX SODIUM 200 MG/2ML IV SOLN
INTRAVENOUS | Status: DC | PRN
  Administered 2023-10-07 (×2): 200 mg via INTRAVENOUS

## 2023-10-07 MED ORDER — VANCOMYCIN HCL 1000 MG IV SOLR
INTRAVENOUS | Status: AC
  Filled 2023-10-07: qty 20

## 2023-10-07 MED ORDER — OXYCODONE HCL 5 MG PO TABS
ORAL_TABLET | ORAL | Status: AC
  Filled 2023-10-07: qty 1

## 2023-10-07 MED ORDER — ONDANSETRON HCL 4 MG/2ML IJ SOLN
INTRAMUSCULAR | Status: AC
  Filled 2023-10-07: qty 2

## 2023-10-07 MED ORDER — BUPIVACAINE HCL (PF) 0.5 % IJ SOLN
INTRAMUSCULAR | Status: DC | PRN
Start: 2023-10-07 — End: 2023-10-07
  Administered 2023-10-07: 15 mL via PERINEURAL

## 2023-10-07 MED ORDER — ONDANSETRON HCL 4 MG/2ML IJ SOLN
INTRAMUSCULAR | Status: DC | PRN
  Administered 2023-10-07: 4 mg via INTRAVENOUS

## 2023-10-07 MED ORDER — PHENYLEPHRINE 80 MCG/ML (10ML) SYRINGE FOR IV PUSH (FOR BLOOD PRESSURE SUPPORT)
PREFILLED_SYRINGE | INTRAVENOUS | Status: DC | PRN
  Administered 2023-10-07: 80 ug via INTRAVENOUS
  Administered 2023-10-07: 120 ug via INTRAVENOUS
  Administered 2023-10-07: 160 ug via INTRAVENOUS
  Administered 2023-10-07: 120 ug via INTRAVENOUS
  Administered 2023-10-07: 80 ug via INTRAVENOUS
  Administered 2023-10-07 (×2): 120 ug via INTRAVENOUS

## 2023-10-07 MED ORDER — FENTANYL CITRATE (PF) 100 MCG/2ML IJ SOLN: 25.0000 ug | INTRAMUSCULAR | Status: DC | PRN

## 2023-10-07 MED ORDER — CHLORHEXIDINE GLUCONATE 0.12 % MT SOLN
15.0000 mL | OROMUCOSAL | Status: AC
  Administered 2023-10-07: 15 mL via OROMUCOSAL
  Filled 2023-10-07: qty 15

## 2023-10-07 MED ORDER — FENTANYL CITRATE (PF) 250 MCG/5ML IJ SOLN
INTRAMUSCULAR | Status: DC | PRN
  Administered 2023-10-07: 100 ug via INTRAVENOUS

## 2023-10-07 MED ORDER — ROCURONIUM BROMIDE 10 MG/ML (PF) SYRINGE
PREFILLED_SYRINGE | INTRAVENOUS | Status: DC | PRN
  Administered 2023-10-07: 50 mg via INTRAVENOUS

## 2023-10-07 MED ORDER — ONDANSETRON 4 MG PO TBDP: 4.0000 mg | ORAL_TABLET | Freq: Three times a day (TID) | ORAL | 0 refills | Status: AC | PRN

## 2023-10-07 MED ORDER — DIPHENHYDRAMINE HCL 50 MG/ML IJ SOLN
INTRAMUSCULAR | Status: DC | PRN
  Administered 2023-10-07: 12.5 mg via INTRAVENOUS

## 2023-10-07 MED ORDER — BUPIVACAINE LIPOSOME 1.3 % IJ SUSP
INTRAMUSCULAR | Status: DC | PRN
Start: 2023-10-07 — End: 2023-10-07
  Administered 2023-10-07: 10 mL via PERINEURAL

## 2023-10-07 MED ORDER — 0.9 % SODIUM CHLORIDE (POUR BTL) OPTIME
TOPICAL | Status: DC | PRN
  Administered 2023-10-07: 1000 mL

## 2023-10-07 MED ORDER — SODIUM CHLORIDE 0.9 % IV SOLN: INTRAVENOUS | Status: DC | PRN

## 2023-10-07 MED ORDER — MIDAZOLAM HCL 2 MG/2ML IJ SOLN: 2.0000 mg | Freq: Once | INTRAMUSCULAR | Status: AC

## 2023-10-07 SURGICAL SUPPLY — 47 items
ANCHOR SUT BIO SW 4.75X19.1 (Anchor) IMPLANT
ANCHOR SWIVELOCK SP KL 4.75 (Anchor) IMPLANT
BAG COUNTER SPONGE SURGICOUNT (BAG) ×2 IMPLANT
BLADE SURG 11 STRL SS (BLADE) ×2 IMPLANT
CANNULA TWIST IN 8.25X7CM (CANNULA) ×2 IMPLANT
DRAPE INCISE IOBAN 66X45 STRL (DRAPES) IMPLANT
DRAPE STERI 35X30 U-POUCH (DRAPES) ×2 IMPLANT
DRAPE U-SHAPE 47X51 STRL (DRAPES) ×2 IMPLANT
DRSG AQUACEL AG ADV 3.5X10 (GAUZE/BANDAGES/DRESSINGS) IMPLANT
DURAPREP 26ML APPLICATOR (WOUND CARE) ×2 IMPLANT
GAUZE PAD ABD 8X10 STRL (GAUZE/BANDAGES/DRESSINGS) ×6 IMPLANT
GAUZE SPONGE 4X4 12PLY STRL (GAUZE/BANDAGES/DRESSINGS) ×2 IMPLANT
GLOVE BIO SURGEON STRL SZ7.5 (GLOVE) ×4 IMPLANT
GLOVE BIOGEL PI IND STRL 8 (GLOVE) ×2 IMPLANT
GOWN STRL REUS W/ TWL LRG LVL3 (GOWN DISPOSABLE) ×2 IMPLANT
GOWN STRL REUS W/TWL XL LVL3 (GOWN DISPOSABLE) ×4 IMPLANT
IMP SYS 2ND FIX PEEK 4.75X19.1 (Miscellaneous) ×1 IMPLANT
IMPL SYS 2ND FX PEEK 4.75X19.1 (Miscellaneous) IMPLANT
KIT BASIN OR (CUSTOM PROCEDURE TRAY) ×2 IMPLANT
KIT TURNOVER KIT B (KITS) ×2 IMPLANT
MANIFOLD NEPTUNE II (INSTRUMENTS) ×2 IMPLANT
NDL HD SCORPION MEGA LOADER (NEEDLE) IMPLANT
NDL SCORPION MULTI FIRE (NEEDLE) IMPLANT
NDL SPNL 18GX3.5 QUINCKE PK (NEEDLE) ×2 IMPLANT
NEEDLE SCORPION MULTI FIRE (NEEDLE) IMPLANT
NEEDLE SPNL 18GX3.5 QUINCKE PK (NEEDLE) ×1 IMPLANT
NS IRRIG 1000ML POUR BTL (IV SOLUTION) ×2 IMPLANT
PACK SHOULDER (CUSTOM PROCEDURE TRAY) ×2 IMPLANT
PAD ARMBOARD 7.5X6 YLW CONV (MISCELLANEOUS) ×4 IMPLANT
PROBE BIPOLAR ATHRO 135MM 90D (MISCELLANEOUS) IMPLANT
SLEEVE ARM SUSPENSION SYSTEM (MISCELLANEOUS) ×2 IMPLANT
SLING ARM FOAM STRAP XLG (SOFTGOODS) IMPLANT
SLING S3 LATERAL DISP (MISCELLANEOUS) ×2 IMPLANT
SPONGE T-LAP 4X18 ~~LOC~~+RFID (SPONGE) ×2 IMPLANT
STRIP CLOSURE SKIN 1/2X4 (GAUZE/BANDAGES/DRESSINGS) IMPLANT
SUT ETHILON 3 0 PS 1 (SUTURE) ×2 IMPLANT
SUT MNCRL AB 3-0 PS2 27 (SUTURE) IMPLANT
SUT MON AB 2-0 CT1 36 (SUTURE) IMPLANT
SUT TIGER TAPE 7 IN WHITE (SUTURE) IMPLANT
SUT VIC AB 0 CT1 27XBRD ANBCTR (SUTURE) IMPLANT
SUT VIC AB 1 CT1 27XBRD ANBCTR (SUTURE) IMPLANT
TAPE FIBER 2MM 7IN #2 BLUE (SUTURE) IMPLANT
TAPE PAPER 3X10 WHT MICROPORE (GAUZE/BANDAGES/DRESSINGS) ×2 IMPLANT
TOWEL GREEN STERILE (TOWEL DISPOSABLE) ×2 IMPLANT
TOWEL GREEN STERILE FF (TOWEL DISPOSABLE) ×2 IMPLANT
TUBING ARTHROSCOPY IRRIG 16FT (MISCELLANEOUS) ×2 IMPLANT
WATER STERILE IRR 1000ML POUR (IV SOLUTION) ×2 IMPLANT

## 2023-10-07 NOTE — H&P (Signed)
ORTHOPAEDIC H and P  REQUESTING PHYSICIAN: Yolonda Kida, MD  PCP:  Roe Rutherford, NP  Chief Complaint: right shoulder rotator cuff tear  HPI: Christopher Solomon is a 42 y.o. male who complains of right shoulder pain and weakness following a seizure and dislocation.  Imaging confirmed greater tuberosity fracture with rotator cuff involvement and [place] and [place].  Here today for open repair.   Past Medical History:  Diagnosis Date   Diabetes mellitus without complication (HCC)    GERD (gastroesophageal reflux disease)    Headaches, cluster    tension   High cholesterol    Hypertension    Neuromuscular disorder (HCC)    seizures   Sleep apnea    Past Surgical History:  Procedure Laterality Date   no surgical history     Social History   Socioeconomic History   Marital status: Married    Spouse name: [name]   Number of children: 1   Years of education: 12th grade   Highest education level: Not on file  Occupational History   Occupation: Surveyor, mining    Comment: [employer]  Tobacco Use   Smoking status: Former    Current packs/day: 0.00    Types: Cigarettes    Quit date: 10/23/1999    Years since quitting: 23.9   Smokeless tobacco: Never  Vaping Use   Vaping status: Never Used  Substance and Sexual Activity   Alcohol use: No    Alcohol/week: 0.0 standard drinks of alcohol   Drug use: No   Sexual activity: Not on file  Other Topics Concern   Not on file  Social History Narrative   Lives with his wife and son.   Caffeine: 2 soft drinks/ day   Occasional coffee use   Social Drivers of Corporate investment banker Strain: Low Risk  (03/15/2023)   Received from [facility], [facility]   Overall Financial Resource Strain (CARDIA)    Difficulty of Paying Living Expenses: Not hard at all  Food Insecurity: No Food Insecurity (09/19/2023)   Hunger Vital Sign    Worried About Running Out of Food in the Last Year: Never  true    Ran Out of Food in the Last Year: Never true  Transportation Needs: No Transportation Needs (09/19/2023)   PRAPARE - Administrator, Civil Service (Medical): No    Lack of Transportation (Non-Medical): No  Physical Activity: Unknown (11/20/2021)   Received from [facility], [facility]   Exercise Vital Sign    Days of Exercise per Week: 4 days    Minutes of Exercise per Session: Not on file  Stress: Unknown (11/20/2021)   Received from [facility], [facility] of Occupational Health - Occupational Stress Questionnaire    Feeling of Stress : Patient declined  Social Connections: Unknown (02/20/2022)   Received from [facility], [facility]   Social Network    Social Network: Not on file   Family History  Problem Relation Age of Onset   Diabetes Paternal Grandfather    Cancer Paternal Grandmother    Lung cancer Maternal Grandmother    Allergies  Allergen Reactions   Codeine Nausea And Vomiting   Metoclopramide Other (See Comments)    Makes him very agitated; can't stop moving.   Prior to Admission medications   Medication Sig Start Date End Date Taking? Authorizing Provider  ALPRAZolam Prudy Feeler) 0.5 MG tablet Take 0.5 mg by mouth at bedtime  as needed for anxiety.   Yes [provider]  carvedilol (COREG) 3.125 MG tablet Take 3.125 mg by mouth 2 (two) times daily with a meal.   Yes [provider]  hydrochlorothiazide (MICROZIDE) 12.5 MG capsule Take 12.5 mg by mouth daily.   Yes [provider]  levETIRAcetam (KEPPRA) 750 MG tablet Take 1 tablet (750 mg total) by mouth every 12 (twelve) hours. 09/21/23 10/21/23 Yes Jerald Kief, MD  pantoprazole (PROTONIX) 40 MG tablet Take 1 tablet (40 mg total) by mouth daily. 09/22/23 10/22/23 Yes Jerald Kief, MD  rosuvastatin (CRESTOR) 40 MG tablet Take 1 tablet (40 mg total) by mouth daily. 03/25/23  Yes Runell Gess, MD  tirzepatide (ZEPBOUND) 12.5 MG/0.5ML  Pen Inject 12.5 mg into the skin once a week. Patient taking differently: Inject 12.5 mg into the skin once a week. Wednesday 09/04/23  Yes   tiZANidine (ZANAFLEX) 4 MG tablet Take 4 mg by mouth every 6 (six) hours as needed for muscle spasms.   Yes [provider]  valsartan (DIOVAN) 320 MG tablet Take 320 mg by mouth daily. 01/25/22  Yes [provider]  Continuous Blood Gluc Transmit (DEXCOM G6 TRANSMITTER) MISC USE TO check sugars 6-8 TIMES DAILY (90 DAY supply) 10/31/21   [provider]  Insulin Disposable Pump (OMNIPOD 5 DEXG7G6 PODS GEN 5) MISC Inject into the skin every 3 (three) days. Insulin pump runs this continuously*    [provider]   No results found.  Positive ROS: All other systems have been reviewed and were otherwise negative with the exception of those mentioned in the HPI and as above.  Physical Exam: General: Alert, no acute distress Cardiovascular: No pedal edema Respiratory: No cyanosis, no use of accessory musculature GI: No organomegaly, abdomen is soft and non-tender Skin: No lesions in the area of chief complaint Neurologic: Sensation intact distally Psychiatric: Patient is competent for consent with normal mood and affect Lymphatic: No axillary or cervical lymphadenopathy  MUSCULOSKELETAL: RUE- Wwp, NVI.  Slightly decreased axillary  Assessment:  Right shoulder instability Right greater tuberosty fracture with rotator cuff avulsion tear Right shoulder humeral fracture  Plan: - plan to go ahead with open repair of the cuff with tuberosity piece and possible Remplissage.  - The risks, benefits, and alternatives were discussed with the patient. There are risks associated with the surgery including, but not limited to, problems with anesthesia (death), infection, differences in leg length/angulation/rotation, fracture of bones, loosening or failure of implants, malunion, nonunion, hematoma (blood accumulation) which may  require surgical drainage, blood clots, pulmonary embolism, nerve injury (foot drop), and blood vessel injury. The patient understands these risks and elects to proceed.   - plan for dc home post op    Yolonda Kida, MD Cell [phone]    10/07/2023 3:08 PM

## 2023-10-07 NOTE — Transfer of Care (Signed)
Immediate Anesthesia Transfer of Care Note  Patient: Christopher Solomon  Procedure(s) Performed: RIGHT SHOULDER OPEN ROTATOR CUFF REPAIR (Right: Shoulder)  Patient Location: PACU  Anesthesia Type:General  Level of Consciousness: awake and patient cooperative  Airway & Oxygen Therapy: Patient Spontanous Breathing and Patient connected to face mask oxygen  Post-op Assessment: Report given to RN and Post -op Vital signs reviewed and stable  Post vital signs: Reviewed and stable  Last Vitals:  Vitals Value Taken Time  BP 124/75 10/07/23 1809  Temp    Pulse 83 10/07/23 1812  Resp 13 10/07/23 1812  SpO2 97 % 10/07/23 1812  Vitals shown include unfiled device data.  Last Pain:  Vitals:   10/07/23 1610  TempSrc:   PainSc: 0-No pain      Patients Stated Pain Goal: 2 (10/07/23 1319)  Complications: No notable events documented.

## 2023-10-07 NOTE — Brief Op Note (Signed)
10/07/2023  5:27 PM  PATIENT:  Christopher Solomon  42 y.o. male  PRE-OPERATIVE DIAGNOSIS:  Right shoulder traumatic rotator cuff tear  POST-OPERATIVE DIAGNOSIS:  Right shoulder traumatic rotator cuff tear  PROCEDURE:  Procedure(s) with comments: RIGHT SHOULDER OPEN ROTATOR CUFF REPAIR (Right) - 90  SURGEON:  Surgeons and Role:    * Yolonda Kida, MD - Primary  PHYSICIAN ASSISTANT: Dion Saucier, PA-C  ANESTHESIA:   regional and general  EBL:  25 mL   BLOOD ADMINISTERED:none  DRAINS: none   LOCAL MEDICATIONS USED:  NONE  SPECIMEN:  No Specimen  DISPOSITION OF SPECIMEN:  N/A  COUNTS:  YES  TOURNIQUET:  * No tourniquets in log *  DICTATION: .Note written in EPIC  PLAN OF CARE: Discharge to home after PACU  PATIENT DISPOSITION:  PACU - hemodynamically stable.   Delay start of Pharmacological VTE agent (>24hrs) due to surgical blood loss or risk of bleeding: not applicable

## 2023-10-07 NOTE — Anesthesia Procedure Notes (Signed)
Procedure Name: Intubation Date/Time: 10/07/2023 4:28 PM  Performed by: Hali Marry, CRNAPre-anesthesia Checklist: Patient identified, Emergency Drugs available, Suction available and Patient being monitored Patient Re-evaluated:Patient Re-evaluated prior to induction Oxygen Delivery Method: Circle system utilized Preoxygenation: Pre-oxygenation with 100% oxygen Induction Type: IV induction Ventilation: Mask ventilation without difficulty Laryngoscope Size: Glidescope and 4 Grade View: Grade I Tube type: Oral Tube size: 7.5 mm Number of attempts: 1 Airway Equipment and Method: Stylet and Oral airway Placement Confirmation: ETT inserted through vocal cords under direct vision, positive ETCO2 and breath sounds checked- equal and bilateral Secured at: 24 cm Tube secured with: Tape Dental Injury: Teeth and Oropharynx as per pre-operative assessment

## 2023-10-07 NOTE — Anesthesia Preprocedure Evaluation (Addendum)
Anesthesia Evaluation  Patient identified by MRN, date of birth, ID band Patient awake    Reviewed: Allergy & Precautions, NPO status , Patient's Chart, lab work & pertinent test results, reviewed documented beta blocker date and time   Airway Mallampati: III  TM Distance: >3 FB Neck ROM: Full    Dental no notable dental hx. (+) Teeth Intact, Dental Advisory Given   Pulmonary sleep apnea (noncompliant with CPAP) , former smoker   Pulmonary exam normal breath sounds clear to auscultation       Cardiovascular hypertension, Pt. on home beta blockers and Pt. on medications + CAD  Normal cardiovascular exam Rhythm:Regular Rate:Normal  TTE 2024 1. Left ventricular ejection fraction, by estimation, is 60 to 65%. The  left ventricle has normal function. The left ventricle has no regional  wall motion abnormalities. There is moderate concentric left ventricular  hypertrophy. Indeterminate diastolic  filling due to E-A fusion.   2. Right ventricular systolic function is normal. The right ventricular  size is normal.   3. The mitral valve was not well visualized. No evidence of mitral valve  regurgitation.   4. The aortic valve was not well visualized. Aortic valve regurgitation  is not visualized.   5. The inferior vena cava is normal in size with greater than 50%  respiratory variability, suggesting right atrial pressure of 3 mmHg.     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)  Renal/GU negative Renal ROS  negative genitourinary   Musculoskeletal negative musculoskeletal ROS (+)    Abdominal   Peds  Hematology negative hematology ROS (+)   Anesthesia Other Findings   Reproductive/Obstetrics                              Anesthesia Physical Anesthesia Plan  ASA: 3  Anesthesia Plan: General and Regional   Post-op Pain Management: Regional block* and Tylenol PO (pre-op)*   Induction: Intravenous  PONV Risk Score and Plan: 2 and Midazolam, Dexamethasone and Ondansetron  Airway Management Planned: Oral ETT  Additional Equipment:   Intra-op Plan:   Post-operative Plan: Extubation in OR  Informed Consent: I have reviewed the patients History and Physical, chart, labs and discussed the procedure including the risks, benefits and alternatives for the proposed anesthesia with the patient or authorized representative who has indicated his/her understanding and acceptance.     Dental advisory given  Plan Discussed with: CRNA  Anesthesia Plan Comments:        Anesthesia Quick Evaluation

## 2023-10-07 NOTE — Anesthesia Procedure Notes (Signed)
Anesthesia Regional Block: Interscalene brachial plexus block   Pre-Anesthetic Checklist: , timeout performed,  Correct Patient, Correct Site, Correct Laterality,  Correct Procedure, Correct Position, site marked,  Risks and benefits discussed,  Pre-op evaluation,  At surgeon's request and post-op pain management  Laterality: Right  Prep: Maximum Sterile Barrier Precautions used, chloraprep       Needles:  Injection technique: Single-shot  Needle Type: Echogenic Stimulator Needle     Needle Length: 5cm  Needle Gauge: 21     Additional Needles:   Procedures:,,,, ultrasound used (permanent image in chart),,    Narrative:  Start time: 10/07/2023 4:00 PM End time: 10/07/2023 4:04 PM Injection made incrementally with aspirations every 5 mL. Anesthesiologist: Elmer Picker, MD

## 2023-10-07 NOTE — Discharge Instructions (Addendum)
Orthopedic surgery discharge instructions:  -Maintain postoperative bandages for 3 days.  You may remove these bandages on post op day 3 and begin showering at that time.  Please do not submerge underwater.  -Maintain your arm in sling at all times.  You should only remove for showering and getting dressed.  No lifting with the operative arm.  -For mild to moderate pain use Tylenol and Advil in alternating fashion around-the-clock.  For breakthrough pain use oxycodone as necessary.  -Please apply ice to the right shoulder for 20-30 minutes out of each hour that you are awake.  Do this around-the-clock for the first 3 days from surgery.  -Follow-up in 2 weeks for routine postoperative check.  

## 2023-10-07 NOTE — Op Note (Signed)
10/07/2023  5:28 PM  PATIENT:  Christopher Solomon    PRE-OPERATIVE DIAGNOSIS:  Right shoulder traumatic rotator cuff tear  POST-OPERATIVE DIAGNOSIS:  Same  PROCEDURE:  RIGHT SHOULDER OPEN ROTATOR CUFF REPAIR  SURGEON:  Yolonda Kida, MD  PHYSICIAN ASSISTANT: Dion Saucier, PA-C, present and scrubbed throughout the case, critical for completion in a timely fashion, and for retraction, instrumentation, and closure.  ANESTHESIA:   General With interscalene Exparel  PREOPERATIVE INDICATIONS:  [name] is a  42 y.o. male with a diagnosis of Right shoulder traumatic rotator cuff tear who elected for surgical management.    The risks benefits and alternatives were discussed with the patient including but not limited to the risks of nonoperative treatment, versus surgical intervention including infection, bleeding, nerve injury, malunion, nonunion, the need for revision surgery, hardware prominence, hardware failure, the need for hardware removal, blood clots, cardiopulmonary complications, conversion to arthroplasty, morbidity, mortality, among others, and they were willing to proceed.  Predicted outcome is good, although there will be at least a six to nine month expected recovery.   OPERATIVE IMPLANTS: Arthrex 4.75 mm swivel lock anchor x 4.  2 for the medial row and 2 for lateral row  OPERATIVE FINDINGS: Retracted posterior superior traumatic rotator cuff tear with small avulsion fragment from the greater tuberosity noted there was significant and organized hematoma consistent with the subacute nature of this injury.  There was a shallow Hill-Sachs just medial to the articular margin of the greater tuberosity at the infraspinatus attachment.  The majority of the supraspinatus was intact.  This was essentially an injury to the infraspinatus which was complete in nature.  There was some posterior aspect of the supraspinatus that was elevated.  The tissue was very mobile once we performed  releases and easily able to be brought to the anatomic footprint.  UNIQUE ASPECTS OF THE CASE: Obese body habitus.  OPERATIVE PROCEDURE: The patient was brought to the operating room and placed in the supine position. General anesthesia was administered. IV antibiotics were given. He was placed in the beach chair position. All bony prominences were padded. The upper extremity was prepped and draped in usual sterile fashion.  Anterolateral approach to the proximal humerus was carried out.  We performed this with care and dissection was carried down through the superficial deltoid fascia.  We then bluntly dissected down into the deep deltoid fascia and split this bluntly.  Deep retractors were placed.  Care was taken to avoid the axillary nerve.  I exposed the fracture site, and placed deep retractors.  This was a very comminuted piece of posterior greater tuberosity.  There was healthy and robust tendon attached to this.  The complete free-floating pieces were irrigated out of the wound and extracted with rondure.  We then identified the full-thickness tear of the infraspinatus and posterior aspect of the supraspinatus.  There was an obvious Hill-Sachs lesion on the posterior aspect of the humeral head.  This was not engaging.  We then moved ahead with our rotator cuff repair.  We placed 2 medial row anchors at the articular margin and 1 that was more posterior was actually a bit in the fracture bed.  This still had good purchase with the 4.75 mm Arthrex swivel lock anchor.  4 separate fiber tapes were passed through the myotendinous junction medial.  We then divided these tails and placed them into 2 separate lateral row anchors also 4.75 mm swivel lock anchors.  This had excellent bone and  tendon compression.  The entire repair moved to the solitary unit following the placement of anchors.  We next irrigated the wounds copiously, and repaired the deep deltoid fascia with Vicryl followed by Vicryl for the  subcutaneous tissue with Monocryl and Steri-Strips for the skin. He was placed in a sling. He had a preoperative regional block as well. He tolerated the procedure well with no complications.   Disposition:  Mr. Fifita will be in an abduction pillow sling for the next 5 weeks.  He will be nonweightbearing essentially through the shoulder.  He can use the arm in regards to activities of daily living while wearing the sling.  He can begin immediate motion to the elbow hand and wrist.  We will start outpatient therapy in 2 weeks.  I will see him in the office 2 weeks for wound check.  Discharge home today from PACU.

## 2023-10-08 ENCOUNTER — Encounter (HOSPITAL_COMMUNITY): Payer: Self-pay | Admitting: Orthopedic Surgery

## 2023-10-08 ENCOUNTER — Ambulatory Visit: Payer: 59 | Admitting: General Practice

## 2023-10-08 NOTE — Anesthesia Postprocedure Evaluation (Signed)
Anesthesia Post Note  Patient: Christopher Solomon  Procedure(s) Performed: RIGHT SHOULDER OPEN ROTATOR CUFF REPAIR (Right: Shoulder)     Patient location during evaluation: PACU Anesthesia Type: Regional and General Level of consciousness: awake and alert Pain management: pain level controlled Vital Signs Assessment: post-procedure vital signs reviewed and stable Respiratory status: spontaneous breathing, nonlabored ventilation, respiratory function stable and patient connected to nasal cannula oxygen Cardiovascular status: blood pressure returned to baseline and stable Postop Assessment: no apparent nausea or vomiting Anesthetic complications: no   No notable events documented.  Last Vitals:  Vitals:   10/07/23 1825 10/07/23 1845  BP:  126/86  Pulse: 83 81  Resp: 18 12  Temp:  (!) 36.3 C  SpO2: 95% 91%    Last Pain:  Vitals:   10/07/23 1845  TempSrc:   PainSc: 8                  [place]

## 2023-10-29 ENCOUNTER — Encounter: Payer: Self-pay | Admitting: Neurology

## 2023-10-29 ENCOUNTER — Ambulatory Visit: Payer: 59 | Admitting: Neurology

## 2023-10-29 VITALS — BP 145/95 | HR 93 | Ht 70.0 in | Wt 310.6 lb

## 2023-10-29 DIAGNOSIS — G930 Cerebral cysts: Secondary | ICD-10-CM

## 2023-10-29 DIAGNOSIS — R569 Unspecified convulsions: Secondary | ICD-10-CM | POA: Diagnosis not present

## 2023-10-29 MED ORDER — VALTOCO 20 MG DOSE 10 MG/0.1ML NA LQPK: NASAL | 5 refills | Status: AC

## 2023-10-29 MED ORDER — LEVETIRACETAM 750 MG PO TABS: 750.0000 mg | ORAL_TABLET | Freq: Two times a day (BID) | ORAL | 3 refills | Status: DC

## 2023-10-29 NOTE — Progress Notes (Signed)
 NEUROLOGY CONSULTATION NOTE  [name] MRN: [id number] DOB: Apr 27, 1981  Referring provider: Dr. Garnette Pelt Primary care provider: Charmaine Heller, NP  Reason for consult:  new onset seizure  Dear Dr Pelt:  Thank you for your kind referral of Christopher Solomon for consultation of the above symptoms. Although his history is well known to you, please allow me to reiterate it for the purpose of our medical record. The patient was accompanied to the clinic by his wife and mother who also provide collateral information. Records and images were personally reviewed where available.   HISTORY OF PRESENT ILLNESS: This is a 43 year old left-handed man with a history of DM2, hypertension, hyperlipidemia, CAD, OSA non-adherent to CPAP, anxiety, migraines, presenting for evaluation of new onset seizures that occurred on 09/19/23. He recalls getting up to use the bathroom and seeing a colored spot in his vision similar to the [employer] that moved around, then he woke up in the ER. His wife heard a commotion in the bathroom then heard loud snoring and found him on the floor with a glazed look. He cussed at them, which is unusual for him. They drove him to the ER where he was found to have right shoulder dislocation, bruising under the left armpit, and left foot injury. As he was being transferred to his room, he was noted to have a brief staring episode followed by generalized tonic-clonic seizure with head turn to the right per wife. He had urinary incontinence and they think he bit his tongue. No focal weakness. Bloodwork in the ER showed elevated troponin and CK (891 -> 1143). He was seen by Cardiology with normal echocardiogram, elevated troponin felt due to demand ischemia from seizure. I personally reviewed MRI brain without contrast which did not show any acute changes, there is an incidental finding of a 2cm probable arachnoid cyst overlying the anterior right frontal convexity, no mass  effect. EEG was normal. He was discharged home on Levetiracetam  750mg  BID.  He is on prn Xanax  and does not take it on a daily basis. No new medications. No sleep deprivation however he has had difficulty with his CPAP full-face mask because he sleeps on his belly. His family notices him dazed off at times, but would respond when called. 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 He denies any gaps in time, olfactory/gustatory hallucinations, focal numbness/tingling/weakness, myoclonic jerks. No further visual aura or convulsions since 09/19/23. He had an episode a couple of weeks ago where he felt really funny, floaty, with no associated confusion for 10 minutes. He has a history of migraines and was evaluated by neurologist Dr. 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 He is not taking these medications and does not recall if due to side effects or ineffective. He continues to have headaches on a near daily basis where he sees spots but not the colorful one he saw prior to the seizure. No nausea/vomiting. He is more sensitive to lights. Pain is over the frontal region radiating diffusely, he has a near daily dull headache that waxes and wanes. Tylenol  helps. If bad, he lies in a dark room and takes Zanaflex . He denies any dizziness, diplopia, dysarthria/dysphagia, bowel/bladder dysfunction. A month prior to the seizure, he was having right-sided neck pain. No alcohol. He gets 6 hours of sleep. Mood is good,  his wife notes irritability since the seizure. Memory is pretty good. He had a fall in the snow a few days ago and injured his left knee. Right shoulder is still sore.   Epilepsy Risk Factors:  His mother has seizures. His maternal aunt has hypoglycemic seizures. He had a normal birth and early development.  There is no history  of febrile convulsions, CNS infections such as meningitis/encephalitis, significant traumatic brain injury, neurosurgical procedures   PAST MEDICAL HISTORY: Past Medical History:  Diagnosis Date   Diabetes mellitus without complication (HCC)    GERD (gastroesophageal reflux disease)    Headaches, cluster    tension   High cholesterol    Hypertension    Neuromuscular disorder (HCC)    seizures   Sleep apnea     PAST SURGICAL HISTORY: Past Surgical History:  Procedure Laterality Date   no surgical history     SHOULDER OPEN ROTATOR CUFF REPAIR Right 10/07/2023   Procedure: RIGHT SHOULDER OPEN ROTATOR CUFF REPAIR;  Surgeon: Sharl Selinda Dover, MD;  Location: [facility] OR;  Service: Orthopedics;  Laterality: Right;  90    MEDICATIONS: Current Outpatient Medications on File Prior to Visit  Medication Sig Dispense Refill   ALPRAZolam  (XANAX ) 0.5 MG tablet Take 0.5 mg by mouth at bedtime as needed for anxiety.     carvedilol  (COREG ) 3.125 MG tablet Take 3.125 mg by mouth 2 (two) times daily with a meal.     Continuous Blood Gluc Transmit (DEXCOM G6 TRANSMITTER) MISC USE TO check sugars 6-8 TIMES DAILY (90 DAY supply)     hydrochlorothiazide  (MICROZIDE ) 12.5 MG capsule Take 12.5 mg by mouth daily.     Insulin  Disposable Pump (OMNIPOD 5 DEXG7G6 PODS GEN 5) MISC Inject into the skin every 3 (three) days. Insulin  pump runs this continuously*     levETIRAcetam  (KEPPRA ) 750 MG tablet Take 1 tablet (750 mg total) by mouth every 12 (twelve) hours. 60 tablet 0   ondansetron  (ZOFRAN -ODT) 4 MG disintegrating tablet Take 1 tablet (4 mg total) by mouth every 8 (eight) hours as needed for nausea or vomiting. 20 tablet 0   oxyCODONE  (ROXICODONE ) 5 MG immediate release tablet Take 1 tablet (5 mg total) by mouth every 4 (four) hours as needed for severe pain (pain score 7-10) or moderate pain (pain score 4-6). 25 tablet 0   pantoprazole  (PROTONIX ) 40 MG tablet Take 1 tablet (40 mg total) by mouth daily. 30  tablet 0   rosuvastatin  (CRESTOR ) 40 MG tablet Take 1 tablet (40 mg total) by mouth daily. 90 tablet 3   tiZANidine  (ZANAFLEX ) 4 MG tablet Take 4 mg by mouth every 6 (six) hours as needed for muscle spasms.     valsartan  (DIOVAN ) 320 MG tablet Take 320 mg by mouth daily.     No current facility-administered medications on file prior to visit.    ALLERGIES: Allergies  Allergen Reactions   Codeine Nausea And Vomiting   Metoclopramide  Other (See Comments)    Makes him very agitated; can't stop moving.    FAMILY HISTORY: Family History  Problem Relation Age of Onset   Diabetes Paternal Grandfather    Cancer Paternal Grandmother    Lung cancer Maternal Grandmother     SOCIAL HISTORY: Social History   Socioeconomic History   Marital status: Married    Spouse name: [name]   Number of children: 1   Years of education: 12th grade   Highest education level: Not on file  Occupational History   Occupation: Street  Maintenance Supervisor    Comment: [employer]  Tobacco Use   Smoking status: Former    Current packs/day: 0.00    Types: Cigarettes    Quit date: 10/23/1999    Years since quitting: 24.0   Smokeless tobacco: Never  Vaping Use   Vaping status: Never Used  Substance and Sexual Activity   Alcohol use: No    Alcohol/week: 0.0 standard drinks of alcohol   Drug use: No   Sexual activity: Not on file  Other Topics Concern   Not on file  Social History Narrative   Lives with his wife and son.   Caffeine: 2 soft drinks/ day   Occasional coffee use   Left handed    Social Drivers of Corporate Investment Banker Strain: Low Risk  (03/15/2023)   Received from [facility], [facility]   Overall Financial Resource Strain (CARDIA)    Difficulty of Paying Living Expenses: Not hard at all  Food Insecurity: No Food Insecurity (09/19/2023)   Hunger Vital Sign    Worried About Running Out of Food in the Last Year: Never true    Ran Out of Food in the Last Year:  Never true  Transportation Needs: No Transportation Needs (09/19/2023)   PRAPARE - Administrator, Civil Service (Medical): No    Lack of Transportation (Non-Medical): No  Physical Activity: Unknown (11/20/2021)   Received from [facility], [facility]   Exercise Vital Sign    Days of Exercise per Week: 4 days    Minutes of Exercise per Session: Not on file  Stress: Unknown (11/20/2021)   Received from [facility], [facility] of Occupational Health - Occupational Stress Questionnaire    Feeling of Stress : Patient declined  Social Connections: Unknown (02/20/2022)   Received from [facility], [facility]   Social Network    Social Network: Not on file  Intimate Partner Violence: Not At Risk (09/19/2023)   Humiliation, Afraid, Rape, and Kick questionnaire    Fear of Current or Ex-Partner: No    Emotionally Abused: No    Physically Abused: No    Sexually Abused: No     PHYSICAL EXAM: Vitals:   10/29/23 0854  BP: (!) 145/95  Pulse: 93  SpO2: 96%   General: No acute distress Head:  Normocephalic/atraumatic Skin/Extremities: No rash, no edema Neurological Exam: Mental status: alert and oriented to person, place, and time, no dysarthria or aphasia, Fund of knowledge is appropriate.  Recent and remote memory are intact,2/3 delayed recall.  Attention and concentration are normal, 5/5 WORLD backwards.  Cranial nerves: CN I: not tested CN II: pupils equal, round, visual fields intact CN III, IV, VI:  full range of motion, no nystagmus, no ptosis CN V: facial sensation intact CN VII: upper and lower face symmetric CN VIII: hearing intact to conversation Bulk & Tone: normal, no fasciculations. Motor: 5/5 throughout with no pronator drift. Sensation: intact to light touch, cold, pin, vibration sense.  No extinction to double simultaneous stimulation.  Romberg test negative Deep Tendon Reflexes: +1 throughout Cerebellar: no incoordination on  finger to nose testing Gait: slow and cautious favoring left knee due to pain Tremor: none   IMPRESSION: This is a 43 year old left-handed man with a history of DM2, hypertension, hyperlipidemia, CAD, OSA non-adherent to CPAP, anxiety, migraines, presenting for evaluation of new onset seizures that occurred on 09/19/23. He had an unwitnessed seizure at home preceded by a visual  aura causing right shoulder dislocation, then a witnessed convulsion preceded by brief staring and right head deviation. EEG normal. MRI brain shows a 2cm right frontal convexity arachnoid cyst, discussed that this is an incidental finding and the semiology of the seizures are not suggestive of frontal onset, however they have read this size of cyst may need surgical intervention and would like Neurosurgery opinion. He has some irritability on the Levetiracetam  but would like to stay on current regimen, refills sent for 750mg  BID. A prescription for Valtoco  nasal spray for seizure rescue provided. We discussed avoidance of seizure triggers, seizure first aid, [place] driving laws to stop driving until 6 months seizure-free. Follow-up in 3 months, call for any changes.    Thank you for allowing me to participate in the care of this patient. Please do not hesitate to call for any questions or concerns.   Darice Shivers, M.D.  CC: Dr. Cindy, Charmaine Heller, NP

## 2023-10-29 NOTE — Patient Instructions (Signed)
 Good to meet you.  Continue Levetiracetam  (Keppra ) 750mg  twice a day  2. A prescription for Valtoco  nasal spray was sent to the pharmacy for seizure rescue. Administer one spray in one nostril, second spray in other nostril (total of one dose). May administer second dose after 4 hours if needed  3. Follow-up in 3 months, call for any changes   Seizure Precautions: 1. If medication has been prescribed for you to prevent seizures, take it exactly as directed.  Do not stop taking the medicine without talking to your doctor first, even if you have not had a seizure in a long time.   2. Avoid activities in which a seizure would cause danger to yourself or to others.  Don't operate dangerous machinery, swim alone, or climb in high or dangerous places, such as on ladders, roofs, or girders.  Do not drive unless your doctor says you may.  3. If you have any warning that you may have a seizure, lay down in a safe place where you can't hurt yourself.    4.  No driving for 6 months from last seizure, as per [place]  state law.   Please refer to the following link on the Epilepsy Foundation of [employer]'s website for more information: http://www.epilepsyfoundation.org/answerplace/Social/driving/drivingu.cfm   5.  Maintain good sleep hygiene. Avoid alcohol.  6.  Contact your doctor if you have any problems that may be related to the medicine you are taking.  7.  Call 911 and bring the patient back to the ED if:        A.  The seizure lasts longer than 5 minutes.       B.  The patient doesn't awaken shortly after the seizure  C.  The patient has new problems such as difficulty seeing, speaking or moving  D.  The patient was injured during the seizure  E.  The patient has a temperature over 102 F (39C)  F.  The patient vomited and now is having trouble breathing

## 2023-11-05 ENCOUNTER — Ambulatory Visit: Payer: 59 | Admitting: Neurology

## 2023-11-09 ENCOUNTER — Other Ambulatory Visit (HOSPITAL_COMMUNITY): Payer: Self-pay

## 2023-11-09 MED ORDER — OMNIPOD 5 DEXG7G6 PODS GEN 5 MISC
3 refills | Status: AC
Start: 2023-09-09 — End: ?
  Filled 2023-11-09: qty 10, 30d supply, fill #0
  Filled 2023-12-03: qty 10, 30d supply, fill #1

## 2023-11-11 ENCOUNTER — Other Ambulatory Visit (HOSPITAL_COMMUNITY): Payer: Self-pay

## 2023-11-11 MED ORDER — VALSARTAN 320 MG PO TABS: 320.0000 mg | ORAL_TABLET | Freq: Every day | ORAL | 5 refills | Status: AC

## 2023-11-11 MED ORDER — OMNIPOD 5 DEXG7G6 PODS GEN 5 MISC
2 refills | Status: AC
  Filled 2023-11-11 – 2024-01-08 (×2): qty 10, 30d supply, fill #0
  Filled 2024-02-05: qty 10, 30d supply, fill #1

## 2023-11-11 MED ORDER — ACCU-CHEK GUIDE TEST VI STRP: ORAL_STRIP | Freq: Four times a day (QID) | 9 refills | Status: AC

## 2023-11-11 MED ORDER — FUROSEMIDE 20 MG PO TABS
20.0000 mg | ORAL_TABLET | Freq: Every day | ORAL | 5 refills | Status: DC
  Filled 2023-11-11 – 2024-04-20 (×2): qty 30, 30d supply, fill #0
  Filled 2024-06-13: qty 30, 30d supply, fill #1
  Filled 2024-07-14: qty 30, 30d supply, fill #2
  Filled 2024-08-14: qty 30, 30d supply, fill #3
  Filled 2024-09-30 – 2024-10-13 (×2): qty 30, 30d supply, fill #4

## 2023-11-11 MED ORDER — DEXCOM G7 SENSOR MISC
5 refills | Status: AC
  Filled 2023-12-13: qty 3, 30d supply, fill #0
  Filled 2024-01-08: qty 3, 30d supply, fill #1
  Filled 2024-02-05: qty 3, 30d supply, fill #2
  Filled 2024-02-28: qty 3, 30d supply, fill #3

## 2023-11-11 MED ORDER — ALPRAZOLAM 0.5 MG PO TABS
0.5000 mg | ORAL_TABLET | Freq: Two times a day (BID) | ORAL | 2 refills | Status: AC
  Filled 2023-11-11 – 2023-12-13 (×2): qty 60, 30d supply, fill #0
  Filled 2024-01-08 – 2024-01-10 (×2): qty 60, 30d supply, fill #1

## 2023-11-11 MED ORDER — LEVETIRACETAM 750 MG PO TABS
750.0000 mg | ORAL_TABLET | Freq: Two times a day (BID) | ORAL | 3 refills | Status: DC
  Filled 2023-11-18: qty 60, 30d supply, fill #0
  Filled 2023-12-13: qty 60, 30d supply, fill #1
  Filled 2024-01-08: qty 60, 30d supply, fill #2
  Filled 2024-02-05: qty 60, 30d supply, fill #3

## 2023-11-11 MED ORDER — INSULIN LISPRO 100 UNIT/ML IJ SOLN
INTRAMUSCULAR | 2 refills | Status: DC
  Filled 2023-12-13: qty 60, 30d supply, fill #0
  Filled 2024-01-08: qty 60, 30d supply, fill #1
  Filled 2024-03-05: qty 30, 30d supply, fill #2
  Filled 2024-04-20: qty 30, 30d supply, fill #3

## 2023-11-11 MED ORDER — POTASSIUM CHLORIDE ER 10 MEQ PO TBCR
10.0000 meq | EXTENDED_RELEASE_TABLET | Freq: Every day | ORAL | 2 refills | Status: DC
  Filled 2024-04-20: qty 30, 30d supply, fill #0
  Filled 2024-06-13: qty 30, 30d supply, fill #1

## 2023-11-11 MED ORDER — ACCU-CHEK SOFTCLIX LANCETS MISC: Freq: Four times a day (QID) | 8 refills | Status: AC

## 2023-11-11 MED ORDER — VALTOCO 20 MG DOSE 10 MG/0.1ML NA LQPK
20.0000 mg | NASAL | 5 refills | Status: AC
  Filled 2023-11-11: qty 10, 30d supply, fill #0

## 2023-11-11 NOTE — Progress Notes (Deleted)
 Cardiology Clinic Note   Patient Name: Christopher Solomon Date of Encounter: 11/11/2023  Primary Care Provider:  Roe Rutherford, NP Primary Cardiologist:  Nanetta Batty, MD  Patient Profile    Christopher Solomon 43 year old male presents the clinic today for follow-up evaluation of his elevated troponin and syncope.  Past Medical History    Past Medical History:  Diagnosis Date   Diabetes mellitus without complication (HCC)    GERD (gastroesophageal reflux disease)    Headaches, cluster    tension   High cholesterol    Hypertension    Neuromuscular disorder (HCC)    seizures   Sleep apnea    Past Surgical History:  Procedure Laterality Date   no surgical history     SHOULDER OPEN ROTATOR CUFF REPAIR Right 10/07/2023   Procedure: RIGHT SHOULDER OPEN ROTATOR CUFF REPAIR;  Surgeon: Yolonda Kida, MD;  Location: [facility] OR;  Service: Orthopedics;  Laterality: Right;  90    Allergies  Allergies  Allergen Reactions   Codeine Nausea And Vomiting   Metoclopramide Other (See Comments)    Makes him very agitated; can't stop moving.    History of Present Illness    [name] has PMH of HTN, migraine, elevated coronary Calcium score, OSA, type 2 diabetes, hyperlipidemia, morbid obesity, DOE, syncope, elevated troponins, and seizure.  He was admitted to the hospital on 09/19/2023 and discharged on 09/21/2023.  He was noted to have syncopal episode and tonic-clonic seizure.  He had an unwitnessed fall.  He had been in the bathroom and his wife found him on the floor and confused.  While in the emergency department he had a tonic-clonic seizure and had postictal response.  He was seen and evaluated by neurology.  He was placed on Keppra.  Cardiology was consulted to evaluate elevated troponin.  His troponins were [id number].  He denied chest discomfort.  He had previous coronary CTA 5/23 which showed calcium score of 610, moderate stenosis and his FFR showed no  significant stenosis.  His elevated troponin was felt to be related to demand ischemia due to his seizure.  Echocardiogram 09/20/2023 showed LVEF of 60-65%, intermediate diastolic parameters, and no significant valvular abnormalities.  He presents to the clinic today for follow-up evaluation and states***.  *** denies chest pain, shortness of breath, lower extremity edema, fatigue, palpitations, melena, hematuria, hemoptysis, diaphoresis, weakness, presyncope, syncope, orthopnea, and PND.  Elevated troponin-denies chest pain.  Troponin was elevated in the setting of demand ischemia secondary to seizure.  Denies exertional chest discomfort.  Previously had reassuring coronary CTA. No plans for ischemic evaluation Heart healthy low-sodium diet Increase physical activity as tolerated  Essential hypertension-BP today***. Maintain blood pressure log Heart healthy low-sodium diet Continue current medical therapy  Coronary calcification, hyperlipidemia-coronary calcium score 610.  FFR reassuring. High-fiber diet Increase physical activity as tolerated Continue rosuvastatin  Seizure-neurologically intact.  Compliant with Keppra.  Denies further episodes of seizure. Following with neurology  Disposition: Follow-up with Dr.Berry or me in 3-4 months.  Home Medications    Prior to Admission medications   Medication Sig Start Date End Date Taking? Authorizing Provider  Accu-Chek Softclix Lancets lancets Use 4 (four) times daily. 10/18/23   Roe Rutherford, NP  ALPRAZolam Prudy Feeler) 0.5 MG tablet Take 0.5 mg by mouth at bedtime as needed for anxiety.    [provider]  ALPRAZolam Prudy Feeler) 0.5 MG tablet Take 1 tablet (0.5 mg total) by mouth 2 (two) times daily as needed 10/18/23  carvedilol (COREG) 3.125 MG tablet Take 3.125 mg by mouth 2 (two) times daily with a meal.    [provider]  Continuous Blood Gluc Transmit (DEXCOM G6 TRANSMITTER) MISC USE TO check sugars 6-8 TIMES  DAILY (90 DAY supply) 10/31/21   [provider]  Continuous Glucose Sensor (DEXCOM G7 SENSOR) MISC Change sensor q 10 days 03/15/23   Roe Rutherford, NP  diazePAM, 20 MG Dose, (VALTOCO 20 MG DOSE) 2 x 10 MG/0.1ML LQPK Administer one spray in one nostril, second spray in other nostril (one dose). May use second dose after 4 hours if needed 10/29/23   Van Clines, MD  diazePAM, 20 MG Dose, (VALTOCO 20 MG DOSE) 2 x 10 MG/0.1ML LQPK Place 20 mg into the nose as needed, then every 4 (four) hours after 10/29/23   Van Clines, MD  furosemide (LASIX) 20 MG tablet Take 1 tablet (20 mg total) by mouth daily. 10/28/23   Roe Rutherford, NP  glucose blood (ACCU-CHEK GUIDE TEST) test strip Test 4 (four) times daily. 10/18/23   Roe Rutherford, NP  hydrochlorothiazide (MICROZIDE) 12.5 MG capsule Take 12.5 mg by mouth daily.    [provider]  Insulin Disposable Pump (OMNIPOD 5 DEXG7G6 PODS GEN 5) MISC Inject into the skin every 3 (three) days. Insulin pump runs this continuously*    [provider]  Insulin Disposable Pump (OMNIPOD 5 DEXG7G6 PODS GEN 5) MISC Apply and change every 3 days 09/09/23   Roe Rutherford, NP  Insulin Disposable Pump (OMNIPOD 5 DEXG7G6 PODS GEN 5) MISC Apply and change every 3 days 09/04/23   Roe Rutherford, NP  insulin lispro (HUMALOG) 100 UNIT/ML injection Inject 2 mLs (200 Units total) via pod q 2 days 11/07/23   Roe Rutherford, NP  levETIRAcetam (KEPPRA) 750 MG tablet Take 1 tablet (750 mg total) by mouth every 12 (twelve) hours. 10/29/23 11/28/23  Van Clines, MD  levETIRAcetam (KEPPRA) 750 MG tablet Take 1 tablet (750 mg total) by mouth every 12 (twelve) hours. 10/29/23   Van Clines, MD  ondansetron (ZOFRAN-ODT) 4 MG disintegrating tablet Take 1 tablet (4 mg total) by mouth every 8 (eight) hours as needed for nausea or vomiting. 10/07/23   Yolonda Kida, MD  oxyCODONE (ROXICODONE) 5 MG immediate release tablet Take 1 tablet (5 mg total)  by mouth every 4 (four) hours as needed for severe pain (pain score 7-10) or moderate pain (pain score 4-6). 10/07/23 10/06/24  Yolonda Kida, MD  pantoprazole (PROTONIX) 40 MG tablet Take 1 tablet (40 mg total) by mouth daily. 09/22/23 10/29/23  Jerald Kief, MD  potassium chloride (KLOR-CON) 10 MEQ tablet Take 1 tablet (10 mEq total) by mouth daily with fluid pill. 10/18/23   Roe Rutherford, NP  rosuvastatin (CRESTOR) 40 MG tablet Take 1 tablet (40 mg total) by mouth daily. 03/25/23   Runell Gess, MD  tiZANidine (ZANAFLEX) 4 MG tablet Take 4 mg by mouth every 6 (six) hours as needed for muscle spasms.    [provider]  valsartan (DIOVAN) 320 MG tablet Take 320 mg by mouth daily. 01/25/22   [provider]  valsartan (DIOVAN) 320 MG tablet Take 1 tablet (320 mg total) by mouth daily. 11/21/22   Roe Rutherford, NP    Family History    Family History  Problem Relation Age of Onset   Diabetes Paternal Grandfather    Cancer Paternal Grandmother    Lung cancer Maternal Grandmother    He  indicated that his mother is alive. He indicated that his father is deceased. He indicated that the status of his maternal grandmother is unknown. He indicated that the status of his paternal grandmother is unknown. He indicated that the status of his paternal grandfather is unknown. He indicated that his son is alive.  Social History    Social History   Socioeconomic History   Marital status: Married    Spouse name: [name]   Number of children: 1   Years of education: 12th grade   Highest education level: Not on file  Occupational History   Occupation: Surveyor, mining    Comment: [employer]  Tobacco Use   Smoking status: Former    Current packs/day: 0.00    Types: Cigarettes    Quit date: 10/23/1999    Years since quitting: 24.0   Smokeless tobacco: Never  Vaping Use   Vaping status: Never Used  Substance and Sexual Activity   Alcohol use: No     Alcohol/week: 0.0 standard drinks of alcohol   Drug use: No   Sexual activity: Not on file  Other Topics Concern   Not on file  Social History Narrative   Lives with his wife and son.   Caffeine: 2 soft drinks/ day   Occasional coffee use   Left handed    Social Drivers of Corporate investment banker Strain: Low Risk  (03/15/2023)   Received from [facility], [facility]   Overall Financial Resource Strain (CARDIA)    Difficulty of Paying Living Expenses: Not hard at all  Food Insecurity: No Food Insecurity (09/19/2023)   Hunger Vital Sign    Worried About Running Out of Food in the Last Year: Never true    Ran Out of Food in the Last Year: Never true  Transportation Needs: No Transportation Needs (09/19/2023)   PRAPARE - Administrator, Civil Service (Medical): No    Lack of Transportation (Non-Medical): No  Physical Activity: Unknown (11/20/2021)   Received from [facility]   Exercise Vital Sign    Days of Exercise per Week: 4 days    Minutes of Exercise per Session: Not on file  Stress: Unknown (11/20/2021)   Received from [facility], [facility] - Occupational Stress Questionnaire    Feeling of Stress : Patient declined  Social Connections: Unknown (02/20/2022)   Received from [facility], [facility]   Social Network    Social Network: Not on file  Intimate Partner Violence: Not At Risk (09/19/2023)   Humiliation, Afraid, Rape, and Kick questionnaire    Fear of Current or Ex-Partner: No    Emotionally Abused: No    Physically Abused: No    Sexually Abused: No     Review of Systems    General:  No chills, fever, night sweats or weight changes.  Cardiovascular:  No chest pain, dyspnea on exertion, edema, orthopnea, palpitations, paroxysmal nocturnal dyspnea. Dermatological: No rash, lesions/masses Respiratory: No cough, dyspnea Urologic: No hematuria, dysuria Abdominal:   No  nausea, vomiting, diarrhea, bright red blood per rectum, melena, or hematemesis Neurologic:  No visual changes, wkns, changes in mental status. All other systems reviewed and are otherwise negative except as noted above.  Physical Exam    VS:  There were no vitals taken for this visit. , BMI There is no height or weight on file to calculate BMI. GEN: Well nourished, well developed, in no acute  distress. HEENT: normal. Neck: Supple, no JVD, carotid bruits, or masses. Cardiac: RRR, no murmurs, rubs, or gallops. No clubbing, cyanosis, edema.  Radials/DP/PT 2+ and equal bilaterally.  Respiratory:  Respirations regular and unlabored, clear to auscultation bilaterally. GI: Soft, nontender, nondistended, BS + x 4. MS: no deformity or atrophy. Skin: warm and dry, no rash. Neuro:  Strength and sensation are intact. Psych: Normal affect.  Accessory Clinical Findings    Recent Labs: 05/20/2023: B Natriuretic Peptide 47.8 09/20/2023: Magnesium 1.9 09/21/2023: ALT 26; BUN 29; Creatinine, Ser 0.95; Hemoglobin 14.5; Platelets 128; Potassium 4.1; Sodium 136   Recent Lipid Panel    Component Value Date/Time   CHOL 257 (H) 02/26/2023 1049   TRIG 235 (H) 02/26/2023 1049   HDL 48 02/26/2023 1049   CHOLHDL 5.4 (H) 02/26/2023 1049   LDLCALC 165 (H) 02/26/2023 1049    No BP recorded.  {Refresh Note OR Click here to enter BP  :1}***    ECG personally reviewed by me today- ***     Echocardiogram 09/20/2023  IMPRESSIONS     1. Left ventricular ejection fraction, by estimation, is 60 to 65%. The  left ventricle has normal function. The left ventricle has no regional  wall motion abnormalities. There is moderate concentric left ventricular  hypertrophy. Indeterminate diastolic  filling due to E-A fusion.   2. Right ventricular systolic function is normal. The right ventricular  size is normal.   3. The mitral valve was not well visualized. No evidence of mitral valve  regurgitation.   4.  The aortic valve was not well visualized. Aortic valve regurgitation  is not visualized.   5. The inferior vena cava is normal in size with greater than 50%  respiratory variability, suggesting right atrial pressure of 3 mmHg.   Comparison(s): No significant change from prior study.   Conclusion(s)/Recommendation(s): Normal biventricular function without  evidence of hemodynamically significant valvular heart disease.   FINDINGS   Left Ventricle: Left ventricular ejection fraction, by estimation, is 60  to 65%. The left ventricle has normal function. The left ventricle has no  regional wall motion abnormalities. Definity contrast agent was given IV  to delineate the left ventricular   endocardial borders. The left ventricular internal cavity size was normal  in size. There is moderate concentric left ventricular hypertrophy.  Indeterminate diastolic filling due to E-A fusion.   Right Ventricle: The right ventricular size is normal. Right ventricular  systolic function is normal.   Left Atrium: Left atrial size was normal in size.   Right Atrium: Right atrial size was normal in size.   Pericardium: There is no evidence of pericardial effusion.   Mitral Valve: The mitral valve was not well visualized. No evidence of  mitral valve regurgitation.   Tricuspid Valve: Tricuspid valve regurgitation is not demonstrated.   Aortic Valve: The aortic valve was not well visualized. Aortic valve  regurgitation is not visualized. Aortic valve mean gradient measures 5.0  mmHg. Aortic valve peak gradient measures 9.4 mmHg. Aortic valve area, by  VTI measures 3.08 cm.   Pulmonic Valve: Pulmonic valve regurgitation is not visualized.   Aorta: The aortic root and ascending aorta are structurally normal, with  no evidence of dilitation.   Venous: The inferior vena cava is normal in size with greater than 50%  respiratory variability, suggesting right atrial pressure of 3 mmHg.   IAS/Shunts:  The interatrial septum was not well visualized.       Assessment & Plan   1.  ***  Thomasene Ripple. Jheri Mitter NP-C     11/11/2023, 1:15 PM [facility] [address] Office [phone] Fax [phone]    I spent***minutes examining this patient, reviewing medications, and using patient centered shared decision making involving their cardiac care.   I spent greater than 20 minutes reviewing their past medical history,  medications, and prior cardiac tests.

## 2023-11-12 ENCOUNTER — Other Ambulatory Visit (HOSPITAL_COMMUNITY): Payer: Self-pay

## 2023-11-13 ENCOUNTER — Ambulatory Visit: Payer: 59 | Admitting: General Practice

## 2023-11-18 ENCOUNTER — Other Ambulatory Visit (HOSPITAL_COMMUNITY): Payer: Self-pay

## 2023-11-19 ENCOUNTER — Other Ambulatory Visit (HOSPITAL_COMMUNITY): Payer: Self-pay

## 2023-11-19 MED ORDER — PANTOPRAZOLE SODIUM 40 MG PO TBEC
40.0000 mg | DELAYED_RELEASE_TABLET | Freq: Every day | ORAL | 1 refills | Status: AC
  Filled 2023-11-19: qty 30, 30d supply, fill #0
  Filled 2024-03-05: qty 30, 30d supply, fill #1
  Filled 2024-11-13: qty 30, 30d supply, fill #2

## 2023-11-19 MED ORDER — MOUNJARO 7.5 MG/0.5ML ~~LOC~~ SOAJ
SUBCUTANEOUS | 2 refills | Status: AC
  Filled 2023-11-19: qty 2, 28d supply, fill #0
  Filled 2024-01-08: qty 2, 28d supply, fill #1

## 2023-12-03 ENCOUNTER — Other Ambulatory Visit (HOSPITAL_COMMUNITY): Payer: Self-pay

## 2023-12-13 ENCOUNTER — Other Ambulatory Visit (HOSPITAL_COMMUNITY): Payer: Self-pay

## 2024-01-08 ENCOUNTER — Other Ambulatory Visit (HOSPITAL_COMMUNITY): Payer: Self-pay

## 2024-01-08 ENCOUNTER — Other Ambulatory Visit: Payer: Self-pay

## 2024-01-09 ENCOUNTER — Other Ambulatory Visit (HOSPITAL_COMMUNITY): Payer: Self-pay

## 2024-01-09 ENCOUNTER — Other Ambulatory Visit: Payer: Self-pay

## 2024-01-10 ENCOUNTER — Other Ambulatory Visit: Payer: Self-pay

## 2024-01-27 ENCOUNTER — Other Ambulatory Visit (HOSPITAL_COMMUNITY): Payer: Self-pay

## 2024-01-27 ENCOUNTER — Telehealth: Payer: 59 | Admitting: Neurology

## 2024-01-27 MED ORDER — MOUNJARO 10 MG/0.5ML ~~LOC~~ SOAJ
10.0000 mg | SUBCUTANEOUS | 1 refills | Status: DC
  Filled 2024-01-27: qty 2, 28d supply, fill #0

## 2024-02-05 ENCOUNTER — Encounter: Payer: Self-pay | Admitting: Neurology

## 2024-02-05 ENCOUNTER — Other Ambulatory Visit (HOSPITAL_COMMUNITY): Payer: Self-pay

## 2024-02-05 ENCOUNTER — Ambulatory Visit: Admitting: Neurology

## 2024-02-05 VITALS — BP 142/90 | HR 76 | Ht 69.0 in | Wt 315.8 lb

## 2024-02-05 DIAGNOSIS — R569 Unspecified convulsions: Secondary | ICD-10-CM

## 2024-02-05 MED ORDER — LEVETIRACETAM 1000 MG PO TABS
1000.0000 mg | ORAL_TABLET | Freq: Two times a day (BID) | ORAL | 3 refills | Status: DC
Start: 2024-02-05 — End: 2024-05-12
  Filled 2024-02-05: qty 60, 30d supply, fill #0
  Filled 2024-03-05: qty 60, 30d supply, fill #1

## 2024-02-05 NOTE — Progress Notes (Unsigned)
 NEUROLOGY FOLLOW UP OFFICE NOTE  AZEEM POORMAN [id number] [birth year]  HISTORY OF PRESENT ILLNESS: I had the pleasure of seeing Mehar Kirkwood in follow-up in the neurology clinic on 02/05/2024. He is accompanied by his son Rushie Goltz who helps supplement the history today. The patient was last seen 3 months ago for new onset seizures that occurred on 09/19/23. The first seizure was preceded by a visual aura, he was then witnessed in the ER to have a staring episode followed by GTC with head turn to the right. MRI brain no acute changes, there was a 2cm probable arachnoid cyst over the right frontal convexity, EEG normal. He was discharged home on Levetiracetam 750mg  BID.  Records and images were personally reviewed where available.  Since his last visit, they deny any convulsions or staring episodes since 09/19/23. He has had "some weirdness," he saw the same visual aura in January for 5 minutes and had a headache. No speech or comprehension changes, no focal numbness/tingling/weakness. Around 1-2 weeks ago, when he got out of the truck, his whole body from the feet to his head felt like how hands go numb for 1-2 minutes. No vision changes, no staring. Periodically he gets little squiggles on the corner of either eye. He is more light sensitive in general, he had to leave [employer] one time because it was messing with his head. He gets 6-7 hours of sleep. He feels the Keppra affects mood a little but not intolerable, he stays tired a lot. No alcohol. He denies any dizziness, no falls.   History on Initial Assessment 10/29/2023: This is a 43 year old left-handed man with a history of DM2, hypertension, hyperlipidemia, CAD, OSA non-adherent to CPAP, anxiety, migraines, presenting for evaluation of new onset seizures that occurred on 09/19/23. He recalls getting up to use the bathroom and seeing a colored spot in his vision similar to the [employer] that moved around, then he woke up in the ER. His wife  heard a commotion in the bathroom then heard loud snoring and found him on the floor with a glazed look. He cussed at them, which is unusual for him. They drove him to the ER where he was found to have right shoulder dislocation, bruising under the left armpit, and left foot injury. As he was being transferred to his room, he was noted to have a brief staring episode followed by generalized tonic-clonic seizure with head turn to the right per wife. He had urinary incontinence and they think he bit his tongue. No focal weakness. Bloodwork in the ER showed elevated troponin and CK (891 -> 1143). He was seen by Cardiology with normal echocardiogram, elevated troponin felt due to demand ischemia from seizure. I personally reviewed MRI brain without contrast which did not show any acute changes, there is an incidental finding of a 2cm probable arachnoid cyst overlying the anterior right frontal convexity, no mass effect. EEG was normal. He was discharged home on Levetiracetam 750mg  BID.  He is on prn Xanax and does not take it on a daily basis. No new medications. No sleep deprivation however he has had difficulty with his CPAP full-face mask because he sleeps on his belly. His family notices him dazed off at times, but would respond when called. 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 He denies any gaps in time, olfactory/gustatory hallucinations, focal numbness/tingling/weakness, myoclonic jerks. No further visual aura or convulsions since 09/19/23. He had an episode a couple  of weeks ago where he felt really funny, "floaty," with no associated confusion for 10 minutes. He has a history of migraines and was evaluated by neurologist Dr. 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 He is not taking these medications and does not recall if  due to side effects or ineffective. He continues to have headaches on a near daily basis where he sees spots but not the colorful one he saw prior to the seizure. No nausea/vomiting. He is more sensitive to lights. Pain is over the frontal region radiating diffusely, he has a near daily dull headache that waxes and wanes. Tylenol helps. If bad, he lies in a dark room and takes Zanaflex. He denies any dizziness, diplopia, dysarthria/dysphagia, bowel/bladder dysfunction. A month prior to the seizure, he was having right-sided neck pain. No alcohol. He gets 6 hours of sleep. Mood is good, his wife notes irritability since the seizure. Memory is pretty good. He had a fall in the snow a few days ago and injured his left knee. Right shoulder is still sore.   Epilepsy Risk Factors:  His mother has seizures. His maternal aunt has hypoglycemic seizures. He had a normal birth and early development.  There is no history of febrile convulsions, CNS infections such as meningitis/encephalitis, significant traumatic brain injury, neurosurgical procedures   PAST MEDICAL HISTORY: Past Medical History:  Diagnosis Date   Diabetes mellitus without complication (HCC)    GERD (gastroesophageal reflux disease)    Headaches, cluster    tension   High cholesterol    Hypertension    Neuromuscular disorder (HCC)    seizures   Sleep apnea     MEDICATIONS: Current Outpatient Medications on File Prior to Visit  Medication Sig Dispense Refill   Accu-Chek Softclix Lancets lancets Use 4 (four) times daily. 100 each 8   ALPRAZolam (XANAX) 0.5 MG tablet Take 0.5 mg by mouth at bedtime as needed for anxiety.     ALPRAZolam (XANAX) 0.5 MG tablet Take 1 tablet (0.5 mg total) by mouth 2 (two) times daily as needed 60 tablet 2   carvedilol (COREG) 3.125 MG tablet Take 3.125 mg by mouth 2 (two) times daily with a meal.     Continuous Blood Gluc Transmit (DEXCOM G6 TRANSMITTER) MISC USE TO check sugars 6-8 TIMES DAILY (90 DAY  supply)     Continuous Glucose Sensor (DEXCOM G7 SENSOR) MISC Change sensor every 10 days 3 each 5   diazePAM, 20 MG Dose, (VALTOCO 20 MG DOSE) 2 x 10 MG/0.1ML LQPK Administer one spray in one nostril, second spray in other nostril (one dose). May use second dose after 4 hours if needed 5 each 5   diazePAM, 20 MG Dose, (VALTOCO 20 MG DOSE) 2 x 10 MG/0.1ML LQPK Place 20 mg into the nose as needed, then every 4 (four) hours after 10 each 5   furosemide (LASIX) 20 MG tablet Take 1 tablet (20 mg total) by mouth daily. 30 tablet 5   glucose blood (ACCU-CHEK GUIDE TEST) test strip Test 4 (four) times daily. 100 each 9   hydrochlorothiazide (MICROZIDE) 12.5 MG capsule Take 12.5 mg by mouth daily.     Insulin Disposable Pump (OMNIPOD 5 DEXG7G6 PODS GEN 5) MISC Inject into the skin every 3 (three) days. Insulin pump runs this continuously*     Insulin Disposable Pump (OMNIPOD 5 DEXG7G6 PODS GEN 5) MISC Apply  and change every 3 days 10 each 3   Insulin Disposable Pump (OMNIPOD 5 DEXG7G6 PODS GEN 5) MISC Apply and change every 3 days 10 each 2   insulin lispro (HUMALOG) 100 UNIT/ML injection Inject 2 mLs (200 Units total) via pod every 2 days 60 mL 2   levETIRAcetam (KEPPRA) 750 MG tablet Take 1 tablet (750 mg total) by mouth every 12 (twelve) hours. 180 tablet 3   ondansetron (ZOFRAN-ODT) 4 MG disintegrating tablet Take 1 tablet (4 mg total) by mouth every 8 (eight) hours as needed for nausea or vomiting. 20 tablet 0   pantoprazole (PROTONIX) 40 MG tablet Take 1 tablet (40 mg total) by mouth daily. 90 tablet 1   potassium chloride (KLOR-CON) 10 MEQ tablet Take 1 tablet (10 mEq total) by mouth daily with fluid pill. 30 tablet 2   rosuvastatin (CRESTOR) 40 MG tablet Take 1 tablet (40 mg total) by mouth daily. 90 tablet 3   tirzepatide (MOUNJARO) 10 MG/0.5ML Pen Inject 10 mg into the skin once a week. 2 mL 1   tirzepatide (MOUNJARO) 7.5 MG/0.5ML Pen Inject 0.5 mLs (7.5 mg dose) into the skin once a week. 2 mL 2    tiZANidine (ZANAFLEX) 4 MG tablet Take 4 mg by mouth every 6 (six) hours as needed for muscle spasms.     valsartan (DIOVAN) 320 MG tablet Take 320 mg by mouth daily.     valsartan (DIOVAN) 320 MG tablet Take 1 tablet (320 mg total) by mouth daily. 90 tablet 5   levETIRAcetam (KEPPRA) 750 MG tablet Take 1 tablet (750 mg total) by mouth every 12 (twelve) hours. 180 tablet 3   No current facility-administered medications on file prior to visit.    ALLERGIES: Allergies  Allergen Reactions   Codeine Nausea And Vomiting   Metoclopramide Other (See Comments)    Makes him very agitated; can't stop moving.    FAMILY HISTORY: Family History  Problem Relation Age of Onset   Diabetes Paternal Grandfather    Cancer Paternal Grandmother    Lung cancer Maternal Grandmother     SOCIAL HISTORY: Social History   Socioeconomic History   Marital status: Married    Spouse name: [name]   Number of children: 1   Years of education: 12th grade   Highest education level: Not on file  Occupational History   Occupation: Surveyor, mining    Comment: [employer]  Tobacco Use   Smoking status: Former    Current packs/day: 0.00    Types: Cigarettes    Quit date: 10/23/1999    Years since quitting: 24.3   Smokeless tobacco: Never  Vaping Use   Vaping status: Never Used  Substance and Sexual Activity   Alcohol use: No    Alcohol/week: 0.0 standard drinks of alcohol   Drug use: No   Sexual activity: Not on file  Other Topics Concern   Not on file  Social History Narrative   Lives with his wife and son.   Caffeine: 2 soft drinks/ day   Occasional coffee use   Left handed    Social Drivers of Corporate investment banker Strain: Low Risk  (11/19/2023)   Received from [facility]   Overall Financial Resource Strain (CARDIA)    Difficulty of Paying Living Expenses: Not hard at all  Food Insecurity: No Food Insecurity (11/19/2023)   Received from [facility]    Hunger Vital Sign    Worried About Running Out of Food in  the Last Year: Never true    Ran Out of Food in the Last Year: Never true  Transportation Needs: No Transportation Needs (11/19/2023)   Received from [facility]   PRAPARE - Transportation    Lack of Transportation (Medical): No    Lack of Transportation (Non-Medical): No  Physical Activity: Unknown (11/20/2021)   Received from [facility], [facility]   Exercise Vital Sign    Days of Exercise per Week: 4 days    Minutes of Exercise per Session: Not on file  Stress: Unknown (11/20/2021)   Received from [facility], [facility] Occup[facility] - Occupational Stress Questionnaire    Feeling of Stress : Patient declined  Social Connections: Unknown (02/20/2022)   Received from [facility], [facility]   Social Network    Social Network: Not on file  Intimate Partner Violence: Not At Risk (09/19/2023)   Humiliation, Afraid, Rape, and Kick questionnaire    Fear of Current or Ex-Partner: No    Emotionally Abused: No    Physically Abused: No    Sexually Abused: No     PHYSICAL EXAM: Vitals:   02/05/24 1240  BP: (!) 142/90  Pulse: 76  SpO2: 99%   General: No acute distress Head:  Normocephalic/atraumatic Skin/Extremities: No rash, no edema Neurological Exam: alert and awake. No aphasia or dysarthria. Fund of knowledge is appropriate.  Attention and concentration are normal.   Cranial nerves: Pupils equal, round. Extraocular movements intact with no nystagmus. Visual fields full.  No facial asymmetry.  Motor: Bulk and tone normal, muscle strength 5/5 throughout with no pronator drift.   Finger to nose testing intact.  Gait narrow-based and steady, able to tandem walk adequately.  Romberg negative.   IMPRESSION: This is a 43 yo LH man with a history of DM2, hypertension, hyperlipidemia, CAD, OSA non-adherent to CPAP, anxiety, migraines, with new onset seizures that occurred on 09/19/23. He  had an unwitnessed seizure at home preceded by a visual aura causing right shoulder dislocation, then a witnessed convulsion preceded by brief staring and right head deviation. EEG normal. MRI brain shows a 2cm right frontal convexity arachnoid cyst, discussed that this is an incidental finding and the semiology of the seizures are not suggestive of frontal onset, we will repeat MRI brain in 08/2024 to assess stability. No convulsions since 08/2023 however he had one episode of the same visual aura and an episode of diffuse paresthesias. We discussed increasing Levetiracetam to 1000mg  BID, monitor symptoms on higher dose. He has prn [place] for seizure rescue. He is aware of [place] driving laws to stop driving after a seizure until 6 months seizure-free. Follow-up in 3 months, call for any changes.    Thank you for allowing me to participate in his care.  Please do not hesitate to call for any questions or concerns.    Rayfield Cairo, M.D.   CC: Lorre Rosin, NP

## 2024-02-05 NOTE — Patient Instructions (Signed)
 Good to see you.  Increase Keppra (Levetiracetam) 1000mg : take 1 tablet twice a day  2. Keep a calendar of your symptoms  3. Follow-up in 3 months, call for any changes   Seizure Precautions: 1. If medication has been prescribed for you to prevent seizures, take it exactly as directed.  Do not stop taking the medicine without talking to your doctor first, even if you have not had a seizure in a long time.   2. Avoid activities in which a seizure would cause danger to yourself or to others.  Don't operate dangerous machinery, swim alone, or climb in high or dangerous places, such as on ladders, roofs, or girders.  Do not drive unless your doctor says you may.  3. If you have any warning that you may have a seizure, lay down in a safe place where you can't hurt yourself.    4.  No driving for 6 months from last seizure, as per [place]  state law.   Please refer to the following link on the Epilepsy Foundation of America's website for more information: http://www.epilepsyfoundation.org/answerplace/Social/driving/drivingu.cfm   5.  Maintain good sleep hygiene. Avoid alcohol  6.  Contact your doctor if you have any problems that may be related to the medicine you are taking.  7.  Call 911 and bring the patient back to the ED if:        A.  The seizure lasts longer than 5 minutes.       B.  The patient doesn't awaken shortly after the seizure  C.  The patient has new problems such as difficulty seeing, speaking or moving  D.  The patient was injured during the seizure  E.  The patient has a temperature over 102 F (39C)  F.  The patient vomited and now is having trouble breathing

## 2024-02-18 ENCOUNTER — Other Ambulatory Visit (HOSPITAL_COMMUNITY): Payer: Self-pay

## 2024-02-18 MED ORDER — ALPRAZOLAM 0.5 MG PO TABS
0.5000 mg | ORAL_TABLET | Freq: Two times a day (BID) | ORAL | 2 refills | Status: DC | PRN
  Filled 2024-02-18: qty 60, 30d supply, fill #0
  Filled 2024-04-20: qty 60, 30d supply, fill #1
  Filled 2024-05-18: qty 60, 30d supply, fill #2

## 2024-02-18 MED ORDER — FAMOTIDINE 40 MG PO TABS
40.0000 mg | ORAL_TABLET | Freq: Two times a day (BID) | ORAL | 2 refills | Status: AC
  Filled 2024-02-18 – 2024-04-20 (×2): qty 60, 30d supply, fill #0
  Filled 2024-05-15: qty 60, 30d supply, fill #1
  Filled 2024-11-17: qty 60, 30d supply, fill #2

## 2024-02-18 MED ORDER — MOUNJARO 12.5 MG/0.5ML ~~LOC~~ SOAJ
12.5000 mg | SUBCUTANEOUS | 3 refills | Status: DC
  Filled 2024-02-18 – 2024-03-05 (×2): qty 2, 28d supply, fill #0

## 2024-02-18 MED ORDER — ROSUVASTATIN CALCIUM 5 MG PO TABS
5.0000 mg | ORAL_TABLET | Freq: Every day | ORAL | 1 refills | Status: AC
  Filled 2024-02-18: qty 30, 30d supply, fill #0
  Filled 2024-04-20: qty 30, 30d supply, fill #1

## 2024-02-18 MED ORDER — CARVEDILOL 3.125 MG PO TABS
3.1250 mg | ORAL_TABLET | Freq: Two times a day (BID) | ORAL | 2 refills | Status: AC
  Filled 2024-02-18: qty 60, 30d supply, fill #0
  Filled 2024-04-20: qty 60, 30d supply, fill #1

## 2024-02-19 ENCOUNTER — Other Ambulatory Visit (HOSPITAL_COMMUNITY): Payer: Self-pay

## 2024-02-24 ENCOUNTER — Other Ambulatory Visit (HOSPITAL_COMMUNITY): Payer: Self-pay

## 2024-02-25 ENCOUNTER — Other Ambulatory Visit (HOSPITAL_COMMUNITY): Payer: Self-pay

## 2024-02-27 ENCOUNTER — Other Ambulatory Visit (HOSPITAL_COMMUNITY): Payer: Self-pay

## 2024-03-04 ENCOUNTER — Other Ambulatory Visit (HOSPITAL_COMMUNITY): Payer: Self-pay

## 2024-03-05 ENCOUNTER — Other Ambulatory Visit (HOSPITAL_COMMUNITY): Payer: Self-pay

## 2024-03-09 ENCOUNTER — Other Ambulatory Visit (HOSPITAL_COMMUNITY): Payer: Self-pay

## 2024-03-18 ENCOUNTER — Telehealth: Payer: Self-pay | Admitting: Neurology

## 2024-03-18 NOTE — Telephone Encounter (Signed)
 Patient has questions about going back to work and he also has paperwork that needs to be filled out by Dr Ty Gales

## 2024-03-19 ENCOUNTER — Encounter: Payer: Self-pay | Admitting: Neurology

## 2024-03-19 ENCOUNTER — Telehealth: Payer: Self-pay | Admitting: Neurology

## 2024-03-19 DIAGNOSIS — Z0279 Encounter for issue of other medical certificate: Secondary | ICD-10-CM

## 2024-03-19 NOTE — Telephone Encounter (Signed)
 Pt called he is going to send disability paperwork via mychart to Dr Ty Gales

## 2024-03-19 NOTE — Telephone Encounter (Signed)
 Pt paid and would like disability to be at front for pickup, once completed please let front now and we will give him a call PAID IN FULL

## 2024-03-20 ENCOUNTER — Other Ambulatory Visit (HOSPITAL_COMMUNITY): Payer: Self-pay

## 2024-03-20 MED ORDER — LEVETIRACETAM 1000 MG PO TABS
1000.0000 mg | ORAL_TABLET | Freq: Two times a day (BID) | ORAL | 3 refills | Status: DC
  Filled 2024-03-20 – 2024-03-28 (×2): qty 60, 30d supply, fill #0
  Filled 2024-04-20: qty 60, 30d supply, fill #1

## 2024-03-23 ENCOUNTER — Other Ambulatory Visit (HOSPITAL_COMMUNITY): Payer: Self-pay

## 2024-03-23 ENCOUNTER — Other Ambulatory Visit (HOSPITAL_BASED_OUTPATIENT_CLINIC_OR_DEPARTMENT_OTHER): Payer: Self-pay

## 2024-03-23 MED ORDER — DEXCOM G7 RECEIVER DEVI
3 refills | Status: AC
  Filled 2024-03-23: qty 3, 30d supply, fill #0
  Filled 2024-06-13: qty 1, 30d supply, fill #0
  Filled 2024-09-30: qty 3, 30d supply, fill #0
  Filled 2024-10-13: qty 3, fill #0

## 2024-03-23 MED ORDER — OMNIPOD 5 DEXG7G6 PODS GEN 5 MISC
3 refills | Status: DC
  Filled 2024-03-23: qty 10, 30d supply, fill #0
  Filled 2024-04-20: qty 10, 30d supply, fill #1
  Filled 2024-05-15: qty 10, 30d supply, fill #2
  Filled 2024-06-18: qty 10, 30d supply, fill #3

## 2024-03-24 ENCOUNTER — Other Ambulatory Visit (HOSPITAL_COMMUNITY): Payer: Self-pay

## 2024-03-24 NOTE — Telephone Encounter (Signed)
 Can you pls check with patient for the paperwork he dropped off, is it to return to work? He has not had any seizures since 09/19/23, he may resume driving. Does he need to me to put here that he can return to work since he is past 6 months? Thanks

## 2024-03-25 NOTE — Telephone Encounter (Signed)
 He said that this is for disability /retirement from the state, he said that he can not work. He said that he has had 3 - 4 mini seizures at night in his sleep said he talked about it at last appointment and that you upped his medication and he has not had any other seizures since then. Pt advised that he can not drive until he is 6 months event free,

## 2024-03-26 ENCOUNTER — Other Ambulatory Visit (HOSPITAL_COMMUNITY): Payer: Self-pay

## 2024-03-26 NOTE — Telephone Encounter (Signed)
 Done, thanks

## 2024-03-26 NOTE — Telephone Encounter (Signed)
 Pt called an informed that paperwork is ready

## 2024-03-28 ENCOUNTER — Other Ambulatory Visit (HOSPITAL_COMMUNITY): Payer: Self-pay

## 2024-03-31 ENCOUNTER — Other Ambulatory Visit (HOSPITAL_COMMUNITY): Payer: Self-pay

## 2024-03-31 MED ORDER — DEXCOM G7 SENSOR MISC
1.0000 | Freq: Once | 3 refills | Status: DC
  Filled 2024-03-31: qty 3, 30d supply, fill #0
  Filled 2024-04-02: qty 2, 20d supply, fill #0
  Filled 2024-04-02: qty 1, 10d supply, fill #0
  Filled 2024-04-20: qty 3, 30d supply, fill #1

## 2024-03-31 MED ORDER — KETOCONAZOLE 2 % EX CREA
1.0000 | TOPICAL_CREAM | Freq: Every day | CUTANEOUS | 0 refills | Status: DC
  Filled 2024-03-31: qty 60, 30d supply, fill #0

## 2024-03-31 MED ORDER — VALSARTAN 320 MG PO TABS
320.0000 mg | ORAL_TABLET | Freq: Every day | ORAL | 5 refills | Status: AC
  Filled 2024-03-31: qty 30, 30d supply, fill #0
  Filled 2024-05-15: qty 30, 30d supply, fill #1
  Filled 2024-06-13: qty 30, 30d supply, fill #2
  Filled 2024-09-30 – 2024-10-13 (×2): qty 30, 30d supply, fill #3
  Filled 2024-11-13: qty 90, 90d supply, fill #4

## 2024-04-01 ENCOUNTER — Other Ambulatory Visit (HOSPITAL_COMMUNITY): Payer: Self-pay

## 2024-04-02 ENCOUNTER — Other Ambulatory Visit (HOSPITAL_COMMUNITY): Payer: Self-pay

## 2024-04-02 ENCOUNTER — Other Ambulatory Visit: Payer: Self-pay

## 2024-04-03 ENCOUNTER — Other Ambulatory Visit (HOSPITAL_COMMUNITY): Payer: Self-pay

## 2024-04-17 ENCOUNTER — Other Ambulatory Visit (HOSPITAL_COMMUNITY): Payer: Self-pay

## 2024-04-20 ENCOUNTER — Other Ambulatory Visit (HOSPITAL_BASED_OUTPATIENT_CLINIC_OR_DEPARTMENT_OTHER): Payer: Self-pay

## 2024-04-20 ENCOUNTER — Other Ambulatory Visit: Payer: Self-pay | Admitting: Cardiovascular Disease

## 2024-04-20 ENCOUNTER — Other Ambulatory Visit: Payer: Self-pay

## 2024-04-20 ENCOUNTER — Other Ambulatory Visit (HOSPITAL_COMMUNITY): Payer: Self-pay

## 2024-04-20 MED ORDER — DEXCOM G7 SENSOR MISC
1.0000 | 3 refills | Status: DC
  Filled 2024-04-20 – 2024-05-15 (×3): qty 3, 30d supply, fill #0
  Filled 2024-06-13: qty 3, 30d supply, fill #1
  Filled 2024-07-08: qty 3, 30d supply, fill #2
  Filled 2024-08-14 – 2024-09-07 (×3): qty 3, 30d supply, fill #3

## 2024-04-20 MED ORDER — MOUNJARO 15 MG/0.5ML ~~LOC~~ SOAJ
SUBCUTANEOUS | 4 refills | Status: AC
  Filled 2024-04-20: qty 2, 28d supply, fill #0
  Filled 2024-05-15: qty 2, 28d supply, fill #1
  Filled 2024-06-13: qty 2, 28d supply, fill #2
  Filled 2024-07-14: qty 2, 28d supply, fill #3
  Filled 2024-08-14 – 2024-11-13 (×4): qty 2, 28d supply, fill #4

## 2024-04-21 ENCOUNTER — Other Ambulatory Visit (HOSPITAL_COMMUNITY): Payer: Self-pay

## 2024-04-21 MED ORDER — ROSUVASTATIN CALCIUM 40 MG PO TABS
40.0000 mg | ORAL_TABLET | Freq: Every day | ORAL | 3 refills | Status: AC
  Filled 2024-04-21: qty 30, 30d supply, fill #0

## 2024-04-27 ENCOUNTER — Other Ambulatory Visit: Payer: Self-pay

## 2024-04-27 ENCOUNTER — Other Ambulatory Visit (HOSPITAL_BASED_OUTPATIENT_CLINIC_OR_DEPARTMENT_OTHER): Payer: Self-pay

## 2024-04-30 ENCOUNTER — Other Ambulatory Visit (HOSPITAL_COMMUNITY): Payer: Self-pay

## 2024-05-08 ENCOUNTER — Other Ambulatory Visit (HOSPITAL_BASED_OUTPATIENT_CLINIC_OR_DEPARTMENT_OTHER): Payer: Self-pay

## 2024-05-11 ENCOUNTER — Other Ambulatory Visit (HOSPITAL_COMMUNITY): Payer: Self-pay

## 2024-05-12 ENCOUNTER — Encounter: Payer: Self-pay | Admitting: Neurology

## 2024-05-12 ENCOUNTER — Other Ambulatory Visit (HOSPITAL_COMMUNITY): Payer: Self-pay

## 2024-05-12 ENCOUNTER — Ambulatory Visit (INDEPENDENT_AMBULATORY_CARE_PROVIDER_SITE_OTHER): Admitting: Neurology

## 2024-05-12 VITALS — BP 145/84 | HR 90 | Ht 69.0 in | Wt 320.0 lb

## 2024-05-12 DIAGNOSIS — G930 Cerebral cysts: Secondary | ICD-10-CM | POA: Diagnosis not present

## 2024-05-12 DIAGNOSIS — R569 Unspecified convulsions: Secondary | ICD-10-CM | POA: Diagnosis not present

## 2024-05-12 MED ORDER — LEVETIRACETAM 1000 MG PO TABS
1000.0000 mg | ORAL_TABLET | Freq: Two times a day (BID) | ORAL | 3 refills | Status: AC
  Filled 2024-05-12 – 2024-05-15 (×2): qty 60, 30d supply, fill #0
  Filled 2024-06-08 – 2024-06-15 (×2): qty 60, 30d supply, fill #1
  Filled 2024-07-14: qty 60, 30d supply, fill #2
  Filled 2024-08-14: qty 60, 30d supply, fill #3
  Filled 2024-09-30 – 2024-10-13 (×2): qty 60, 30d supply, fill #4
  Filled 2024-11-13: qty 60, 30d supply, fill #5

## 2024-05-12 NOTE — Patient Instructions (Addendum)
 Good to see you.  Schedule repeat MRI brain with and without contrast for November 2025  2. Continue Keppra  (Levetiracetam ) 1000mg  twice a day  3. Keep a calendar of the headaches  4. Follow-up in 6 months, call for any changes   Seizure Precautions: 1. If medication has been prescribed for you to prevent seizures, take it exactly as directed.  Do not stop taking the medicine without talking to your doctor first, even if you have not had a seizure in a long time.   2. Avoid activities in which a seizure would cause danger to yourself or to others.  Don't operate dangerous machinery, swim alone, or climb in high or dangerous places, such as on ladders, roofs, or girders.  Do not drive unless your doctor says you may.  3. If you have any warning that you may have a seizure, lay down in a safe place where you can't hurt yourself.    4.  No driving for 6 months from last seizure, as per [place]  state law.   Please refer to the following link on the Epilepsy Foundation of America's website for more information: http://www.epilepsyfoundation.org/answerplace/Social/driving/drivingu.cfm   5.  Maintain good sleep hygiene. Avoid alcohol  6.  Contact your doctor if you have any problems that may be related to the medicine you are taking.  7.  Call 911 and bring the patient back to the ED if:        A.  The seizure lasts longer than 5 minutes.       B.  The patient doesn't awaken shortly after the seizure  C.  The patient has new problems such as difficulty seeing, speaking or moving  D.  The patient was injured during the seizure  E.  The patient has a temperature over 102 F (39C)  F.  The patient vomited and now is having trouble breathing

## 2024-05-12 NOTE — Progress Notes (Signed)
 NEUROLOGY FOLLOW UP OFFICE NOTE  ELIN SEATS [id number] October 07, 1981  HISTORY OF PRESENT ILLNESS: I had the pleasure of seeing Kazimir Hartnett in follow-up in the neurology clinic on 05/12/2024. He is again accompanied by his wife who helps supplement the history today. The patient was last seen 3 months ago for new onset seizures that occurred 09/19/2023. The first seizure was preceded by a visual aura, he was then witnessed in the ER to have a staring episode followed by GTC with head turn to the right. MRI brain no acute changes, there was a 2cm probable arachnoid cyst over the right frontal convexity, EEG normal. On his last visit, he reported an episode of visual aura in January with a headache, then another episode in April where he had diffuse body numbness. Sometimes he has little squiggles in the corner of either eye. Levetiracetam  increased to 1000mg  BID.   He is doing better on increased dose of Levetiracetam . He has had the squigglies (not often) but no aura. No convulsions since 08/2023. He denies any episodes of waking up tense since 01/2024. He cannot tolerate the heat for longer than 20-30 minutes, he gets spent/fatigued more quickly. His wife denies any staring/unresponsive episodes. He notices he zones out but denies any loss of time. His face occasional goes numb, R>L. He felt it a couple of times yesterday, he started going to the gym. The symptoms go down either arm lasting a couple of minutes. No neck pain. He has right-sided sciatica. He had right shoulder surgery in 09/2023 and continues on strengthening exercises. He has a frontal sinus headache every other day, taking Tylenol  when unbearable. He lies in the dark and symptoms go away. He gets 7 hours of sleep. Mood has been rough, they have been going through a lot.   History on Initial Assessment 10/29/2023: This is a 43 year old left-handed man with a history of DM2, hypertension, hyperlipidemia, CAD, OSA non-adherent to CPAP,  anxiety, migraines, presenting for evaluation of new onset seizures that occurred on 09/19/23. He recalls getting up to use the bathroom and seeing a colored spot in his vision similar to the [employer] that moved around, then he woke up in the ER. His wife heard a commotion in the bathroom then heard loud snoring and found him on the floor with a glazed look. He cussed at them, which is unusual for him. They drove him to the ER where he was found to have right shoulder dislocation, bruising under the left armpit, and left foot injury. As he was being transferred to his room, he was noted to have a brief staring episode followed by generalized tonic-clonic seizure with head turn to the right per wife. He had urinary incontinence and they think he bit his tongue. No focal weakness. Bloodwork in the ER showed elevated troponin and CK (891 -> 1143). He was seen by Cardiology with normal echocardiogram, elevated troponin felt due to demand ischemia from seizure. I personally reviewed MRI brain without contrast which did not show any acute changes, there is an incidental finding of a 2cm probable arachnoid cyst overlying the anterior right frontal convexity, no mass effect. EEG was normal. He was discharged home on Levetiracetam  750mg  BID.  He is on prn Xanax  and does not take it on a daily basis. No new medications. No sleep deprivation however he has had difficulty with his CPAP full-face mask because he sleeps on his belly. His family notices him dazed off at times,  but would respond when called. 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 He denies any gaps in time, olfactory/gustatory hallucinations, focal numbness/tingling/weakness, myoclonic jerks. No further visual aura or convulsions since 09/19/23. He had an episode a couple of weeks ago where he felt really funny, floaty, with no associated confusion for 10 minutes. 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 He is not taking these medications and does not recall if due to side effects or ineffective. He continues to have headaches on a near daily basis where he sees spots but not the colorful one he saw prior to the seizure. No nausea/vomiting. He is more sensitive to lights. Pain is over the frontal region radiating diffusely, he has a near daily dull headache that waxes and wanes. Tylenol  helps. If bad, he lies in a dark room and takes Zanaflex . He denies any dizziness, diplopia, dysarthria/dysphagia, bowel/bladder dysfunction. A month prior to the seizure, he was having right-sided neck pain. No alcohol. He gets 6 hours of sleep. Mood is good, his wife notes irritability since the seizure. Memory is pretty good. He had a fall in the snow a few days ago and injured his left knee. Right shoulder is still sore.   Epilepsy Risk Factors:  His mother has seizures. His maternal aunt has hypoglycemic seizures. He had a normal birth and early development.  There is no history of febrile convulsions, CNS infections such as meningitis/encephalitis, significant traumatic brain injury, neurosurgical procedures   PAST MEDICAL HISTORY: Past Medical History:  Diagnosis Date   Diabetes mellitus without complication (HCC)    GERD (gastroesophageal reflux disease)    Headaches, cluster    tension   High cholesterol    Hypertension    Neuromuscular disorder (HCC)    seizures   Sleep apnea     MEDICATIONS: Current Outpatient Medications on File Prior to Visit  Medication Sig Dispense Refill   Accu-Chek Softclix Lancets lancets Use 4 (four) times daily. 100 each 8   ALPRAZolam  (XANAX ) 0.5 MG tablet Take 0.5 mg by mouth at bedtime as needed for anxiety.     ALPRAZolam  (XANAX ) 0.5 MG tablet Take 1 tablet  (0.5 mg total) by mouth 2 (two) times daily as needed 60 tablet 2   ALPRAZolam  (XANAX ) 0.5 MG tablet TAKE 1 TABLET BY MOUTH TWICE DAILY AS NEEDED 60 tablet 2   carvedilol  (COREG ) 3.125 MG tablet Take 3.125 mg by mouth 2 (two) times daily with a meal.     carvedilol  (COREG ) 3.125 MG tablet Take 1 tablet (3.125 mg total) by mouth 2 (two) times daily. 180 tablet 2   Continuous Glucose Receiver (DEXCOM G7 RECEIVER) DEVI Use as directed 6 (six) times daily. 3 each 3   Continuous Glucose Sensor (DEXCOM G7 SENSOR) MISC Change sensor every 10 days 3 each 5   Continuous Glucose Sensor (DEXCOM G7 SENSOR) MISC Apply on arm and use as directed. Change every 10 days. 3 each 3   famotidine  (PEPCID ) 40 MG tablet Take 1 tablet (40 mg total) by mouth 2 (two) times daily. 60 tablet 2   furosemide  (LASIX ) 20 MG tablet Take 1 tablet (20 mg total) by mouth daily. 30 tablet 5   glucose blood (ACCU-CHEK GUIDE TEST) test strip Test 4 (four) times daily. 100 each 9  hydrochlorothiazide  (MICROZIDE ) 12.5 MG capsule Take 12.5 mg by mouth daily.     Insulin  Disposable Pump (OMNIPOD 5 DEXG7G6 PODS GEN 5) MISC Inject into the skin every 3 (three) days. Insulin  pump runs this continuously*     Insulin  Disposable Pump (OMNIPOD 5 DEXG7G6 PODS GEN 5) MISC Apply and change every 3 days 10 each 3   Insulin  Disposable Pump (OMNIPOD 5 DEXG7G6 PODS GEN 5) MISC Apply and change every 3 days 10 each 2   Insulin  Disposable Pump (OMNIPOD 5 DEXG7G6 PODS GEN 5) MISC Place one system onto the skin every 3 (three) days. 10 each 3   insulin  lispro (HUMALOG ) 100 UNIT/ML injection Inject 200 Units via pod every 2 days 60 mL 2   ketoconazole  (NIZORAL ) 2 % cream Apply topically daily as directed 60 g 0   levETIRAcetam  (KEPPRA ) 1000 MG tablet Take 1 tablet (1,000 mg total) by mouth 2 (two) times daily. 180 tablet 3   levETIRAcetam  (KEPPRA ) 1000 MG tablet Take 1 tablet (1,000 mg total) by mouth 2 (two) times daily. 60 tablet 3   ondansetron   (ZOFRAN -ODT) 4 MG disintegrating tablet Take 1 tablet (4 mg total) by mouth every 8 (eight) hours as needed for nausea or vomiting. 20 tablet 0   pantoprazole  (PROTONIX ) 40 MG tablet Take 1 tablet (40 mg total) by mouth daily. 90 tablet 1   potassium chloride  (KLOR-CON ) 10 MEQ tablet Take 1 tablet (10 mEq total) by mouth daily with fluid pill. 30 tablet 2   tirzepatide  (MOUNJARO ) 15 MG/0.5ML Pen Inject 0.5 mLs (15 mg dose) into the skin once a week. 2 mL 4   tiZANidine  (ZANAFLEX ) 4 MG tablet Take 4 mg by mouth every 6 (six) hours as needed for muscle spasms.     valsartan  (DIOVAN ) 320 MG tablet Take 320 mg by mouth daily.     valsartan  (DIOVAN ) 320 MG tablet Take 1 tablet (320 mg total) by mouth daily. 90 tablet 5   valsartan  (DIOVAN ) 320 MG tablet Take 1 tablet (320 mg total) by mouth daily. 90 tablet 5   diazePAM , 20 MG Dose, (VALTOCO  20 MG DOSE) 2 x 10 MG/0.1ML LQPK Administer one spray in one nostril, second spray in other nostril (one dose). May use second dose after 4 hours if needed (Patient not taking: Reported on 05/12/2024) 5 each 5   diazePAM , 20 MG Dose, (VALTOCO  20 MG DOSE) 2 x 10 MG/0.1ML LQPK Place 20 mg into the nose as needed, then every 4 (four) hours after (Patient not taking: Reported on 05/12/2024) 10 each 5   rosuvastatin  (CRESTOR ) 40 MG tablet Take 1 tablet (40 mg total) by mouth daily. (Patient not taking: Reported on 05/12/2024) 90 tablet 3   rosuvastatin  (CRESTOR ) 5 MG tablet Take 1 tablet (5 mg total) by mouth at bedtime, STOP simvastatin (Patient not taking: Reported on 05/12/2024) 90 tablet 1   tirzepatide  (MOUNJARO ) 7.5 MG/0.5ML Pen Inject 0.5 mLs (7.5 mg dose) into the skin once a week. 2 mL 2   No current facility-administered medications on file prior to visit.    ALLERGIES: Allergies  Allergen Reactions   Codeine Nausea And Vomiting   Metoclopramide  Other (See Comments)    Makes him very agitated; can't stop moving.    FAMILY HISTORY: Family History  Problem  Relation Age of Onset   Diabetes Paternal Grandfather    Cancer Paternal Grandmother    Lung cancer Maternal Grandmother     SOCIAL HISTORY: Social History   Socioeconomic History  Marital status: Married    Spouse name: [name]   Number of children: 1   Years of education: 12th grade   Highest education level: Not on file  Occupational History   Occupation: Surveyor, mining    Comment: [employer]  Tobacco Use   Smoking status: Former    Current packs/day: 0.00    Types: Cigarettes    Quit date: 10/23/1999    Years since quitting: 24.5   Smokeless tobacco: Never  Vaping Use   Vaping status: Never Used  Substance and Sexual Activity   Alcohol use: No    Alcohol/week: 0.0 standard drinks of alcohol   Drug use: No   Sexual activity: Not on file  Other Topics Concern   Not on file  Social History Narrative   Lives with his wife and son.   Caffeine: 2soft drinks/ day   Occasional coffee use   Left handed    Social Drivers of Corporate investment banker Strain: Low Risk  (11/19/2023)   Received from [facility]   Overall Financial Resource Strain (CARDIA)    Difficulty of Paying Living Expenses: Not hard at all  Food Insecurity: No Food Insecurity (11/19/2023)   Received from [facility]   Hunger Vital Sign    Within the past 12 months, you worried that your food would run out before you got the money to buy more.: Never true    Within the past 12 months, the food you bought just didn't last and you didn't have money to get more.: Never true  Transportation Needs: No Transportation Needs (11/19/2023)   Received from [facility] - Transportation    Lack of Transportation (Medical): No    Lack of Transportation (Non-Medical): No  Physical Activity: Unknown (11/20/2021)   Received from [facility]   Exercise Vital Sign    On average, how many days per week do you engage in moderate to strenuous exercise (like a brisk walk)?: 4 days     Minutes of Exercise per Session: Not on file  Stress: Unknown (11/20/2021)   Received from [facility] - Occupational Stress Questionnaire    Feeling of Stress : Patient declined  Social Connections: Unknown (02/20/2022)   Received from [facility]   Social Network    Social Network: Not on file  Intimate Partner Violence: Not At Risk (09/19/2023)   Humiliation, Afraid, Rape, and Kick questionnaire    Fear of Current or Ex-Partner: No    Emotionally Abused: No    Physically Abused: No    Sexually Abused: No     PHYSICAL EXAM: Vitals:   05/12/24 1532  BP: (!) 145/84  Pulse: 90  SpO2: 96%   General: No acute distress Head:  Normocephalic/atraumatic Skin/Extremities: No rash, no edema Neurological Exam: alert and awake. No aphasia or dysarthria. Fund of knowledge is appropriate.  Attention and concentration are normal.   Cranial nerves: Pupils equal, round. Extraocular movements intact with no nystagmus. Visual fields full.  No facial asymmetry.  Motor: Bulk and tone normal, muscle strength 5/5 throughout with no pronator drift.   Finger to nose testing intact.  Gait narrow-based and steady, able to tandem walk adequately.  Romberg negative.   IMPRESSION: This is a 43 yo LH man with a history of DM2, hypertension, hyperlipidemia, CAD, OSA non-adherent to CPAP, anxiety, migraines, with new onset seizures that occurred on 09/19/23. He had an unwitnessed seizure at home preceded  by a visual aura causing right shoulder dislocation, then a witnessed convulsion preceded by brief staring and right head deviation. EEG normal. MRI brain shows a 2cm right frontal convexity arachnoid cyst, discussed that this is an incidental finding and the semiology of the seizures are not suggestive of frontal onset, we will repeat MRI brain in 08/2024 to assess stability. No convulsions since 08/2023. Continue Levetiracetam  1000mg  BID. He was advised to keep a  calendar of the headaches and minimize over the counter pain medication to 3 a week to avoid rebound headaches. He is aware of [place] driving laws to stop driving after a seizure until 6 months seizure-free. Follow-up in 6 months, call for any changes.   Thank you for allowing me to participate in his care.  Please do not hesitate to call for any questions or concerns.    Darice Shivers, M.D.   CC: Charmaine Heller, NP

## 2024-05-13 ENCOUNTER — Other Ambulatory Visit (HOSPITAL_COMMUNITY): Payer: Self-pay

## 2024-05-15 ENCOUNTER — Other Ambulatory Visit (HOSPITAL_COMMUNITY): Payer: Self-pay

## 2024-05-15 MED ORDER — KETOCONAZOLE 2 % EX CREA
1.0000 | TOPICAL_CREAM | CUTANEOUS | 0 refills | Status: AC
  Filled 2024-05-15: qty 30, 15d supply, fill #0
  Filled 2024-06-13: qty 30, 15d supply, fill #1

## 2024-05-18 ENCOUNTER — Other Ambulatory Visit: Payer: Self-pay

## 2024-05-26 ENCOUNTER — Ambulatory Visit: Admitting: Cardiovascular Disease

## 2024-06-08 ENCOUNTER — Other Ambulatory Visit (HOSPITAL_COMMUNITY): Payer: Self-pay

## 2024-06-13 ENCOUNTER — Other Ambulatory Visit (HOSPITAL_COMMUNITY): Payer: Self-pay

## 2024-06-15 ENCOUNTER — Other Ambulatory Visit: Payer: Self-pay

## 2024-06-15 ENCOUNTER — Other Ambulatory Visit (HOSPITAL_COMMUNITY): Payer: Self-pay

## 2024-06-15 MED ORDER — ALPRAZOLAM 0.5 MG PO TABS
0.5000 mg | ORAL_TABLET | Freq: Two times a day (BID) | ORAL | 2 refills | Status: DC | PRN
  Filled 2024-06-15: qty 60, 30d supply, fill #0
  Filled 2024-07-14 – 2024-07-17 (×2): qty 60, 30d supply, fill #1
  Filled 2024-08-14: qty 60, 30d supply, fill #2

## 2024-06-16 ENCOUNTER — Other Ambulatory Visit (HOSPITAL_COMMUNITY): Payer: Self-pay

## 2024-06-18 ENCOUNTER — Other Ambulatory Visit (HOSPITAL_COMMUNITY): Payer: Self-pay

## 2024-06-19 ENCOUNTER — Other Ambulatory Visit (HOSPITAL_COMMUNITY): Payer: Self-pay

## 2024-06-26 ENCOUNTER — Other Ambulatory Visit (HOSPITAL_COMMUNITY): Payer: Self-pay

## 2024-06-26 MED ORDER — INSULIN LISPRO 100 UNIT/ML IJ SOLN
INTRAMUSCULAR | 2 refills | Status: AC
  Filled 2024-06-26 – 2024-07-14 (×2): qty 30, 30d supply, fill #0
  Filled 2024-08-14: qty 30, 30d supply, fill #1
  Filled 2024-09-30: qty 60, 60d supply, fill #2

## 2024-07-06 ENCOUNTER — Other Ambulatory Visit (HOSPITAL_COMMUNITY): Payer: Self-pay

## 2024-07-08 ENCOUNTER — Other Ambulatory Visit (HOSPITAL_COMMUNITY): Payer: Self-pay

## 2024-07-08 ENCOUNTER — Other Ambulatory Visit: Payer: Self-pay

## 2024-07-08 MED ORDER — OMNIPOD 5 DEXG7G6 PODS GEN 5 MISC
3 refills | Status: DC
  Filled 2024-07-11: qty 10, 30d supply, fill #0
  Filled 2024-08-14: qty 10, 30d supply, fill #1
  Filled 2024-09-30: qty 10, 30d supply, fill #2

## 2024-07-11 ENCOUNTER — Other Ambulatory Visit (HOSPITAL_COMMUNITY): Payer: Self-pay

## 2024-07-13 ENCOUNTER — Other Ambulatory Visit: Payer: Self-pay

## 2024-07-14 ENCOUNTER — Other Ambulatory Visit: Payer: Self-pay

## 2024-07-14 ENCOUNTER — Other Ambulatory Visit (HOSPITAL_COMMUNITY): Payer: Self-pay

## 2024-07-16 ENCOUNTER — Other Ambulatory Visit (HOSPITAL_COMMUNITY): Payer: Self-pay

## 2024-07-17 ENCOUNTER — Other Ambulatory Visit: Payer: Self-pay

## 2024-07-17 ENCOUNTER — Other Ambulatory Visit (HOSPITAL_COMMUNITY): Payer: Self-pay

## 2024-07-23 ENCOUNTER — Other Ambulatory Visit (HOSPITAL_COMMUNITY): Payer: Self-pay

## 2024-07-24 ENCOUNTER — Other Ambulatory Visit (HOSPITAL_COMMUNITY): Payer: Self-pay

## 2024-07-24 MED ORDER — POTASSIUM CHLORIDE ER 10 MEQ PO TBCR
10.0000 meq | EXTENDED_RELEASE_TABLET | Freq: Every day | ORAL | 2 refills | Status: AC
  Filled 2024-07-24 – 2024-10-13 (×3): qty 30, 30d supply, fill #0
  Filled 2024-11-13: qty 30, 30d supply, fill #1

## 2024-08-04 ENCOUNTER — Other Ambulatory Visit (HOSPITAL_COMMUNITY): Payer: Self-pay

## 2024-08-15 ENCOUNTER — Other Ambulatory Visit (HOSPITAL_COMMUNITY): Payer: Self-pay

## 2024-08-17 ENCOUNTER — Other Ambulatory Visit: Payer: Self-pay

## 2024-08-18 ENCOUNTER — Other Ambulatory Visit: Payer: Self-pay | Admitting: Medical Genetics

## 2024-08-18 ENCOUNTER — Other Ambulatory Visit (HOSPITAL_COMMUNITY): Payer: Self-pay

## 2024-08-19 ENCOUNTER — Other Ambulatory Visit (HOSPITAL_COMMUNITY): Payer: Self-pay

## 2024-08-20 ENCOUNTER — Other Ambulatory Visit (HOSPITAL_COMMUNITY)
Admission: RE | Admit: 2024-08-20 | Discharge: 2024-08-20 | Disposition: A | Discharge status: Home / Self Care | Payer: Self-pay | Source: Ambulatory Visit | Attending: Medical Genetics | Admitting: Medical Genetics

## 2024-08-22 ENCOUNTER — Ambulatory Visit
Admission: RE | Admit: 2024-08-22 | Discharge: 2024-08-22 | Disposition: A | Source: Ambulatory Visit | Attending: Neurology

## 2024-08-22 DIAGNOSIS — G930 Cerebral cysts: Secondary | ICD-10-CM

## 2024-08-22 DIAGNOSIS — R569 Unspecified convulsions: Secondary | ICD-10-CM

## 2024-08-22 MED ORDER — GADOPICLENOL 0.5 MMOL/ML IV SOLN
10.0000 mL | Freq: Once | INTRAVENOUS | Status: AC | PRN
  Administered 2024-08-22: 10 mL via INTRAVENOUS

## 2024-08-23 ENCOUNTER — Ambulatory Visit: Payer: Self-pay | Admitting: Neurology

## 2024-08-24 NOTE — Telephone Encounter (Signed)
-----   Message from Darice CHRISTELLA Shivers sent at 08/23/2024 11:14 PM EST ----- Pls let him know brain MRI is stable, no change from prior scan. Thanks ----- Message ----- From: Interface, Rad Results In Sent: 08/22/2024   4:55 PM EST To: Darice CHRISTELLA Shivers, MD

## 2024-08-24 NOTE — Telephone Encounter (Signed)
 Pt called an informed brain MRI is stable, no change from prior scan.

## 2024-08-26 ENCOUNTER — Other Ambulatory Visit: Payer: Self-pay

## 2024-08-28 ENCOUNTER — Other Ambulatory Visit (HOSPITAL_COMMUNITY): Payer: Self-pay

## 2024-08-28 LAB — GENECONNECT MOLECULAR SCREEN: Genetic Analysis Overall Interpretation: NEGATIVE

## 2024-08-31 ENCOUNTER — Other Ambulatory Visit (HOSPITAL_COMMUNITY): Payer: Self-pay

## 2024-09-02 ENCOUNTER — Other Ambulatory Visit (HOSPITAL_COMMUNITY): Payer: Self-pay

## 2024-09-02 ENCOUNTER — Encounter: Payer: Self-pay | Admitting: Neurology

## 2024-09-07 ENCOUNTER — Other Ambulatory Visit (HOSPITAL_COMMUNITY): Payer: Self-pay

## 2024-09-08 ENCOUNTER — Other Ambulatory Visit (HOSPITAL_COMMUNITY): Payer: Self-pay

## 2024-09-30 ENCOUNTER — Other Ambulatory Visit (HOSPITAL_COMMUNITY): Payer: Self-pay

## 2024-10-01 ENCOUNTER — Other Ambulatory Visit (HOSPITAL_COMMUNITY): Payer: Self-pay

## 2024-10-01 ENCOUNTER — Other Ambulatory Visit: Payer: Self-pay

## 2024-10-01 MED ORDER — OMNIPOD 5 DEXG7G6 PODS GEN 5 MISC
1.0000 | 3 refills | Status: AC
  Filled 2024-10-01: qty 30, 90d supply, fill #0
  Filled 2024-10-13 (×2): qty 10, 30d supply, fill #0

## 2024-10-08 ENCOUNTER — Other Ambulatory Visit (HOSPITAL_BASED_OUTPATIENT_CLINIC_OR_DEPARTMENT_OTHER): Payer: Self-pay

## 2024-10-08 MED ORDER — DEXCOM G7 SENSOR MISC
3 refills | Status: AC
  Filled 2024-10-08 – 2024-10-13 (×2): qty 1, 14d supply, fill #0

## 2024-10-13 ENCOUNTER — Other Ambulatory Visit: Payer: Self-pay

## 2024-10-13 ENCOUNTER — Other Ambulatory Visit (HOSPITAL_COMMUNITY): Payer: Self-pay

## 2024-10-13 ENCOUNTER — Other Ambulatory Visit (HOSPITAL_BASED_OUTPATIENT_CLINIC_OR_DEPARTMENT_OTHER): Payer: Self-pay

## 2024-10-13 MED ORDER — DEXCOM G7 SENSOR MISC
3 refills | Status: AC
  Filled 2024-10-13 – 2024-10-16 (×2): qty 6, 84d supply, fill #0
  Filled 2024-11-17: qty 6, 60d supply, fill #0

## 2024-10-13 MED ORDER — OMNIPOD 5 DEXG7G6 PODS GEN 5 MISC
1 refills | Status: AC
  Filled 2024-10-13 – 2024-11-13 (×2): qty 30, 90d supply, fill #0

## 2024-10-14 ENCOUNTER — Other Ambulatory Visit: Payer: Self-pay

## 2024-10-16 ENCOUNTER — Other Ambulatory Visit: Payer: Self-pay

## 2024-10-16 ENCOUNTER — Other Ambulatory Visit (HOSPITAL_COMMUNITY): Payer: Self-pay

## 2024-10-16 MED ORDER — DEXCOM G7 SENSOR MISC
5 refills | Status: AC
  Filled 2024-10-16: qty 3, 30d supply, fill #0

## 2024-11-13 ENCOUNTER — Other Ambulatory Visit: Payer: Self-pay

## 2024-11-13 ENCOUNTER — Other Ambulatory Visit (HOSPITAL_COMMUNITY): Payer: Self-pay

## 2024-11-13 MED ORDER — FUROSEMIDE 20 MG PO TABS
20.0000 mg | ORAL_TABLET | Freq: Every day | ORAL | 5 refills | Status: AC
  Filled 2024-11-13: qty 90, 90d supply, fill #0

## 2024-11-17 ENCOUNTER — Other Ambulatory Visit: Payer: Self-pay

## 2024-11-17 ENCOUNTER — Other Ambulatory Visit (HOSPITAL_COMMUNITY): Payer: Self-pay

## 2024-11-17 ENCOUNTER — Encounter: Payer: Self-pay | Admitting: Pharmacist

## 2024-11-17 MED ORDER — ALPRAZOLAM 0.5 MG PO TABS
0.5000 mg | ORAL_TABLET | Freq: Two times a day (BID) | ORAL | 2 refills | Status: AC | PRN
  Filled 2024-11-17: qty 60, 30d supply, fill #0

## 2024-11-20 ENCOUNTER — Other Ambulatory Visit (HOSPITAL_BASED_OUTPATIENT_CLINIC_OR_DEPARTMENT_OTHER): Payer: Self-pay

## 2024-11-20 MED ORDER — TIRZEPATIDE 5 MG/0.5ML ~~LOC~~ SOAJ
5.0000 mg | SUBCUTANEOUS | 3 refills | Status: AC
  Filled 2024-11-20: qty 2, 28d supply, fill #0

## 2024-11-23 ENCOUNTER — Other Ambulatory Visit (HOSPITAL_COMMUNITY): Payer: Self-pay

## 2024-11-24 ENCOUNTER — Other Ambulatory Visit (HOSPITAL_COMMUNITY): Payer: Self-pay

## 2024-11-25 ENCOUNTER — Other Ambulatory Visit (HOSPITAL_COMMUNITY): Payer: Self-pay

## 2024-11-25 ENCOUNTER — Other Ambulatory Visit: Payer: Self-pay

## 2024-12-18 ENCOUNTER — Ambulatory Visit: Admitting: Neurology
